# Patient Record
Sex: Female | Born: 1935 | Race: White | Hispanic: No | Marital: Married | Smoking: Former smoker
Health system: Southern US, Community
[De-identification: ages and names within clinical notes are randomized; demographics above are authoritative.]

## PROBLEM LIST (undated history)

## (undated) DIAGNOSIS — D649 Anemia, unspecified: Secondary | ICD-10-CM

## (undated) DIAGNOSIS — M1990 Unspecified osteoarthritis, unspecified site: Secondary | ICD-10-CM

## (undated) DIAGNOSIS — I714 Abdominal aortic aneurysm, without rupture, unspecified: Secondary | ICD-10-CM

## (undated) DIAGNOSIS — I10 Essential (primary) hypertension: Secondary | ICD-10-CM

## (undated) DIAGNOSIS — C801 Malignant (primary) neoplasm, unspecified: Secondary | ICD-10-CM

## (undated) DIAGNOSIS — W19XXXA Unspecified fall, initial encounter: Secondary | ICD-10-CM

## (undated) DIAGNOSIS — E785 Hyperlipidemia, unspecified: Secondary | ICD-10-CM

## (undated) DIAGNOSIS — I739 Peripheral vascular disease, unspecified: Secondary | ICD-10-CM

## (undated) DIAGNOSIS — E039 Hypothyroidism, unspecified: Secondary | ICD-10-CM

## (undated) DIAGNOSIS — N189 Chronic kidney disease, unspecified: Secondary | ICD-10-CM

## (undated) HISTORY — PX: APPENDECTOMY: SHX54

## (undated) HISTORY — DX: Abdominal aortic aneurysm, without rupture: I71.4

## (undated) HISTORY — PX: BREAST BIOPSY: SHX20

## (undated) HISTORY — DX: Essential (primary) hypertension: I10

## (undated) HISTORY — DX: Abdominal aortic aneurysm, without rupture, unspecified: I71.40

## (undated) HISTORY — DX: Malignant (primary) neoplasm, unspecified: C80.1

## (undated) HISTORY — DX: Peripheral vascular disease, unspecified: I73.9

## (undated) HISTORY — DX: Hyperlipidemia, unspecified: E78.5

## (undated) HISTORY — DX: Unspecified fall, initial encounter: W19.XXXA

## (undated) HISTORY — DX: Chronic kidney disease, unspecified: N18.9

## (undated) HISTORY — PX: OTHER SURGICAL HISTORY: SHX169

## (undated) HISTORY — DX: Anemia, unspecified: D64.9

## (undated) HISTORY — DX: Unspecified osteoarthritis, unspecified site: M19.90

## (undated) HISTORY — PX: CORONARY ARTERY BYPASS GRAFT: SHX141

---

## 1998-03-19 ENCOUNTER — Other Ambulatory Visit: Admission: RE | Admit: 1998-03-19 | Discharge: 1998-03-19 | Payer: Self-pay | Admitting: Gynecology

## 1998-07-02 ENCOUNTER — Other Ambulatory Visit: Admission: RE | Admit: 1998-07-02 | Discharge: 1998-07-02 | Payer: Self-pay | Admitting: Gynecology

## 1999-01-15 ENCOUNTER — Encounter: Payer: Self-pay | Admitting: Cardiothoracic Surgery

## 1999-01-15 ENCOUNTER — Inpatient Hospital Stay (HOSPITAL_COMMUNITY): Admission: AD | Admit: 1999-01-15 | Discharge: 1999-01-21 | Payer: Self-pay | Admitting: *Deleted

## 1999-01-16 ENCOUNTER — Encounter: Payer: Self-pay | Admitting: Cardiothoracic Surgery

## 1999-01-17 ENCOUNTER — Encounter: Payer: Self-pay | Admitting: Cardiothoracic Surgery

## 1999-01-18 ENCOUNTER — Encounter: Payer: Self-pay | Admitting: Cardiothoracic Surgery

## 1999-01-19 ENCOUNTER — Encounter: Payer: Self-pay | Admitting: Cardiothoracic Surgery

## 1999-01-20 ENCOUNTER — Encounter: Payer: Self-pay | Admitting: Cardiothoracic Surgery

## 1999-06-04 ENCOUNTER — Other Ambulatory Visit: Admission: RE | Admit: 1999-06-04 | Discharge: 1999-06-04 | Payer: Self-pay | Admitting: Gynecology

## 1999-12-10 ENCOUNTER — Other Ambulatory Visit: Admission: RE | Admit: 1999-12-10 | Discharge: 1999-12-10 | Payer: Self-pay | Admitting: Gynecology

## 2001-05-04 ENCOUNTER — Other Ambulatory Visit: Admission: RE | Admit: 2001-05-04 | Discharge: 2001-05-04 | Payer: Self-pay | Admitting: Gynecology

## 2002-06-06 ENCOUNTER — Other Ambulatory Visit: Admission: RE | Admit: 2002-06-06 | Discharge: 2002-06-06 | Payer: Self-pay | Admitting: Gynecology

## 2002-08-30 HISTORY — PX: CORONARY ANGIOPLASTY WITH STENT PLACEMENT: SHX49

## 2003-03-29 ENCOUNTER — Encounter: Payer: Self-pay | Admitting: Cardiology

## 2003-03-29 ENCOUNTER — Inpatient Hospital Stay (HOSPITAL_COMMUNITY): Admission: RE | Admit: 2003-03-29 | Discharge: 2003-04-03 | Payer: Self-pay | Admitting: Cardiology

## 2003-07-19 ENCOUNTER — Other Ambulatory Visit: Admission: RE | Admit: 2003-07-19 | Discharge: 2003-07-19 | Payer: Self-pay | Admitting: Gynecology

## 2005-02-26 ENCOUNTER — Ambulatory Visit: Payer: Self-pay | Admitting: Cardiology

## 2005-03-16 ENCOUNTER — Ambulatory Visit: Payer: Self-pay

## 2005-04-15 ENCOUNTER — Ambulatory Visit: Payer: Self-pay | Admitting: Cardiology

## 2005-04-30 ENCOUNTER — Ambulatory Visit: Payer: Self-pay

## 2005-06-07 ENCOUNTER — Ambulatory Visit: Payer: Self-pay | Admitting: Cardiology

## 2005-06-22 ENCOUNTER — Ambulatory Visit: Payer: Self-pay

## 2005-10-20 ENCOUNTER — Ambulatory Visit: Payer: Self-pay | Admitting: Cardiology

## 2005-12-21 ENCOUNTER — Ambulatory Visit: Payer: Self-pay

## 2006-02-15 ENCOUNTER — Other Ambulatory Visit: Admission: RE | Admit: 2006-02-15 | Discharge: 2006-02-15 | Payer: Self-pay | Admitting: Gynecology

## 2006-03-16 ENCOUNTER — Ambulatory Visit: Payer: Self-pay

## 2006-03-22 ENCOUNTER — Ambulatory Visit: Payer: Self-pay | Admitting: Cardiology

## 2006-09-06 ENCOUNTER — Ambulatory Visit: Payer: Self-pay | Admitting: Cardiology

## 2006-09-06 ENCOUNTER — Ambulatory Visit (HOSPITAL_COMMUNITY): Admission: RE | Admit: 2006-09-06 | Discharge: 2006-09-06 | Payer: Self-pay | Admitting: Cardiology

## 2007-03-23 ENCOUNTER — Ambulatory Visit: Payer: Self-pay | Admitting: Cardiology

## 2008-01-07 IMAGING — CR DG THORACIC SPINE 2V
3 series · 3 of 3 positions shown · non-contrast
Comparison: None.

CLINICAL DATA: No injury.  Neck pain and shoulder pain. 
 CERVICAL SPINE ? 4 VIEW:

[t t-spine a.p.]
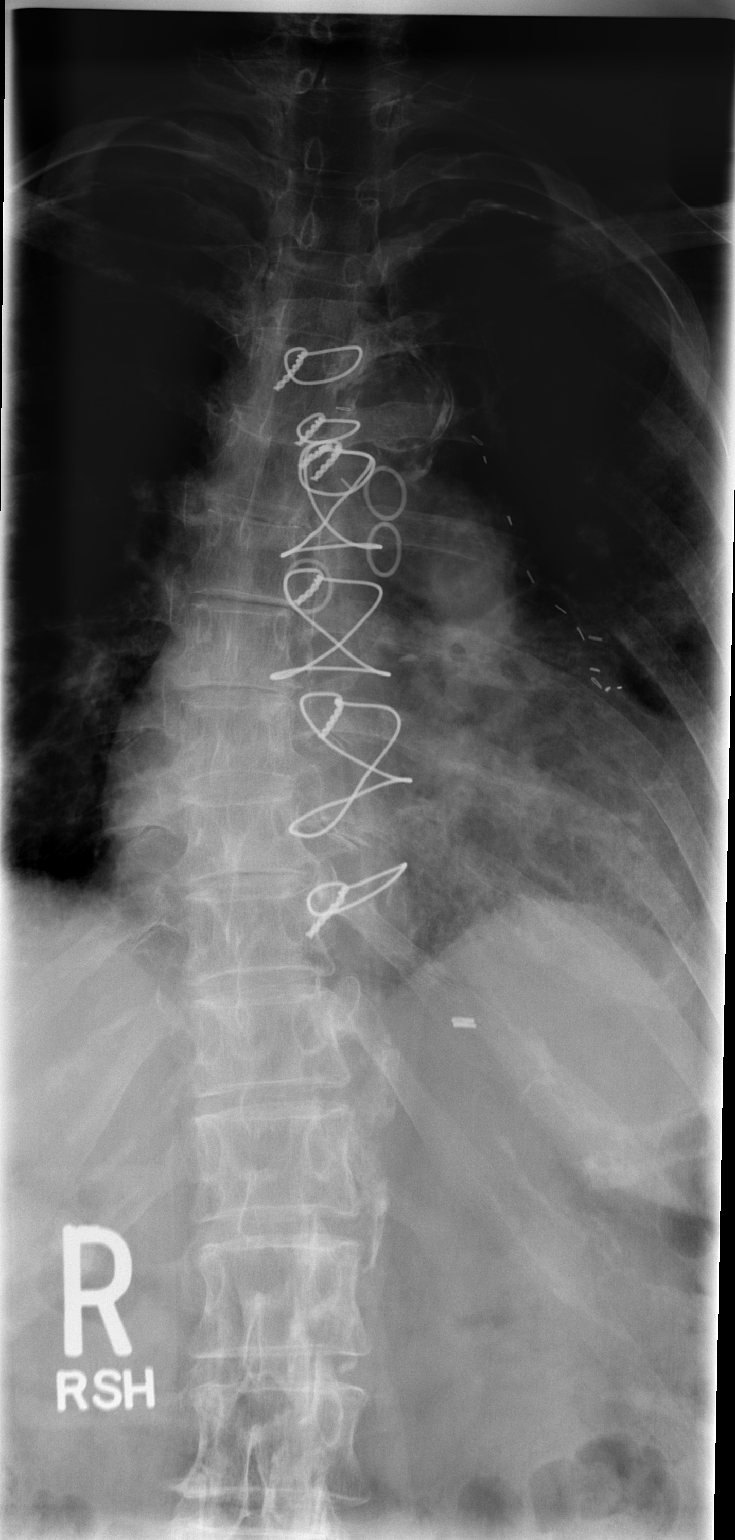

[t t-spine lat (1 of 2)]
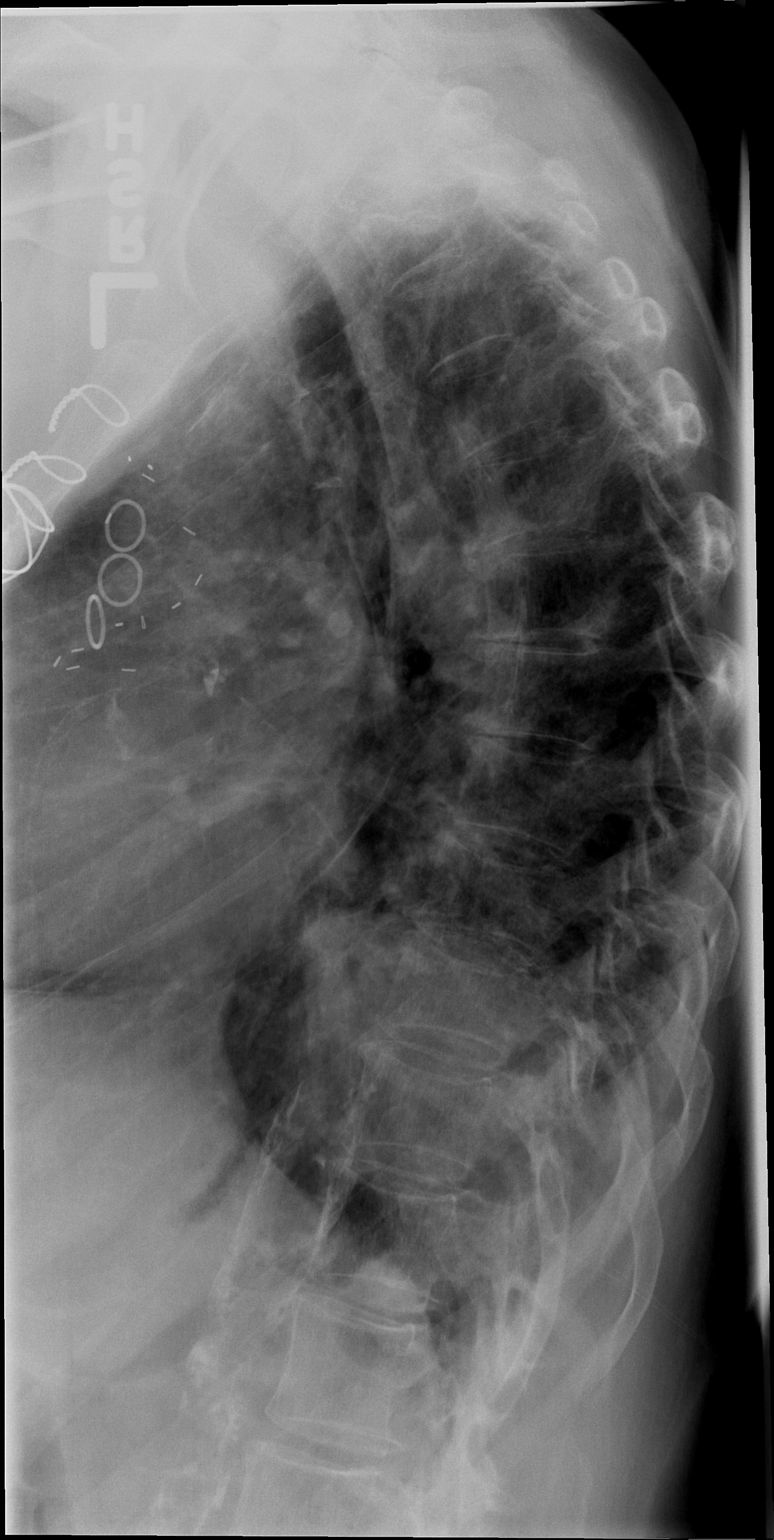

[t t-spine lat (2 of 2)]
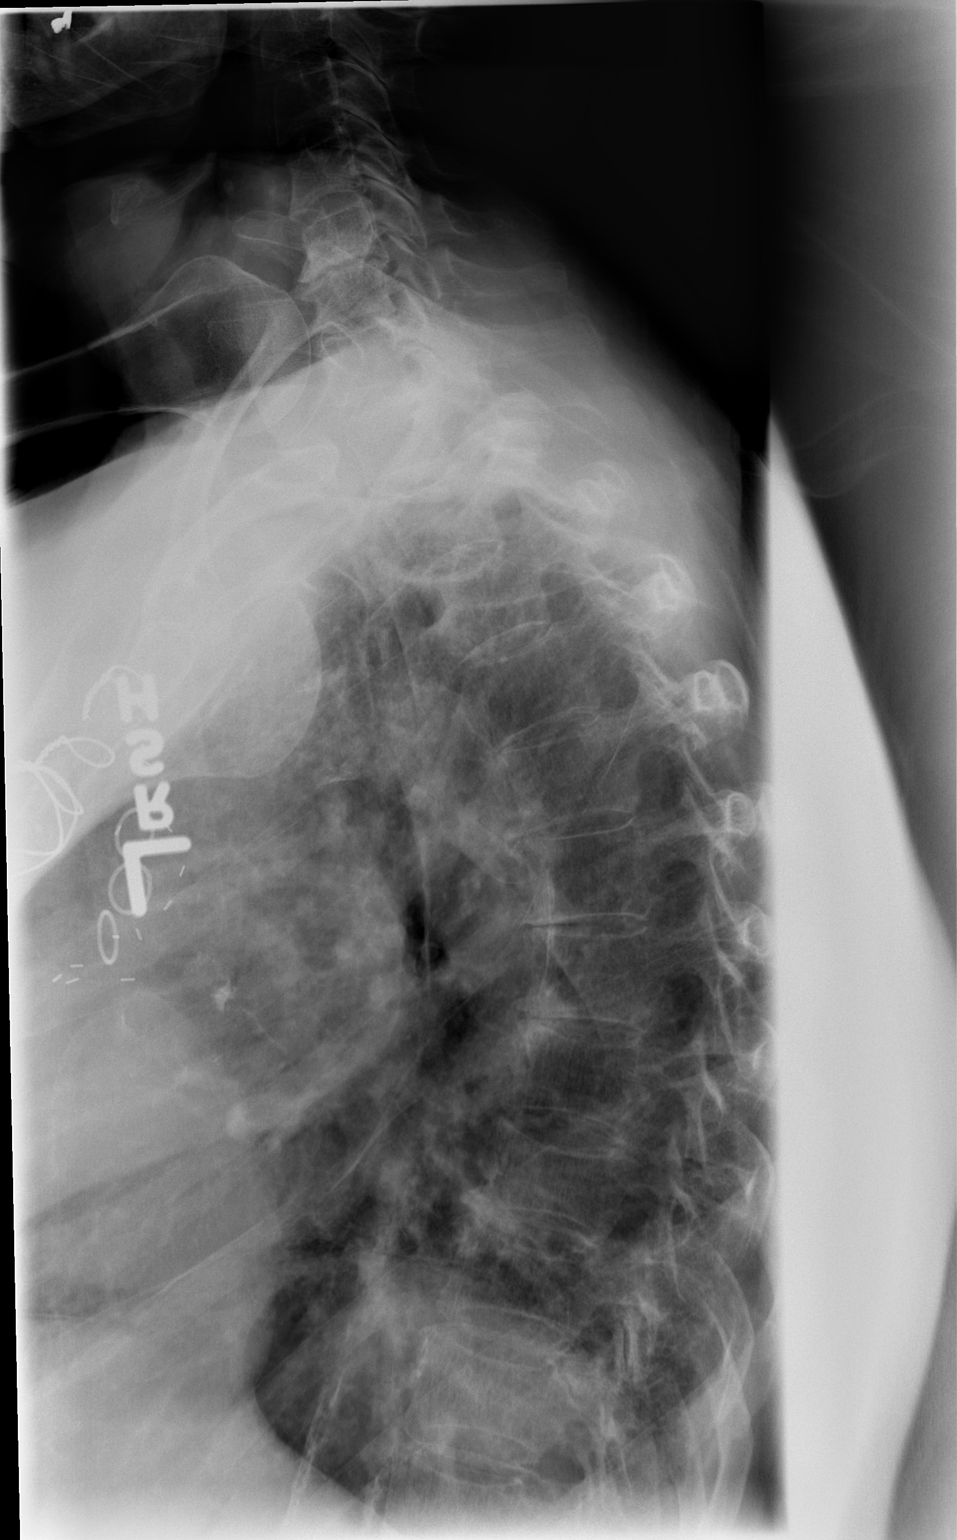

[3 of 3 positions shown; findings below may reference images not displayed]

FINDINGS: There is severe narrowing of the C5, C6 and C6-7 discs with posterior osteophytic ridging.  There reversal of the normal cervical lordosis as well.  Soft tissues are within normal limits. There is no vertebral body height loss.  Right 5-6 neural foraminal narrowing and left 6-7 neural foraminal narrowing is present.  There is failure of fusion of the posterior elements of C7.
IMPRESSION: Marked degenerative disc disease at C5-6 and C6-7 as described. 
 THORACIC SPINE ? 2 VIEW:
FINDINGS: Mild dextroscoliosis of the mid thoracic spine is present.  The bones are demineralized.  There is no vertebral body height loss to suggest a compression fracture.   Disc space narrowing is present throughout the mid thoracic spine.  Vascular calcifications are prominent.
IMPRESSION: Minimal dextroscoliosis.  No acute bony injury.

## 2008-01-07 IMAGING — CR DG CERVICAL SPINE COMPLETE 4+V
7 series · 7 of 7 positions shown · non-contrast
Comparison: None.

CLINICAL DATA: No injury.  Neck pain and shoulder pain. 
 CERVICAL SPINE ? 4 VIEW:

[w c-spine lat]
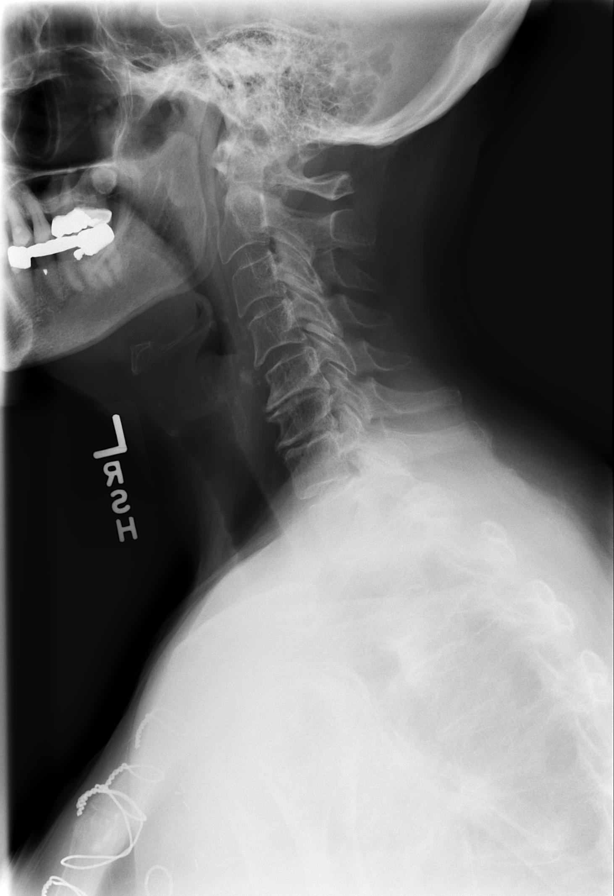

[w c-spine oblique (1 of 2)]
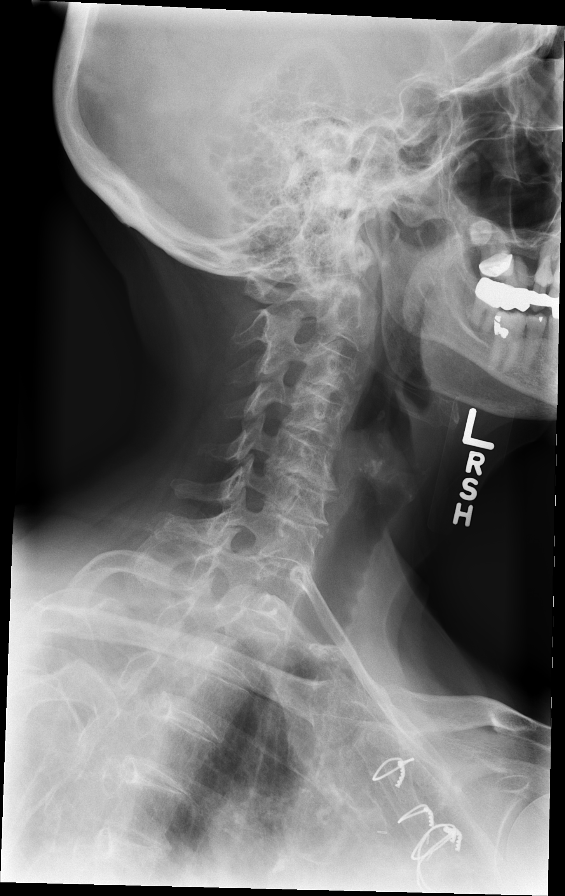

[w c-spine oblique (2 of 2)]
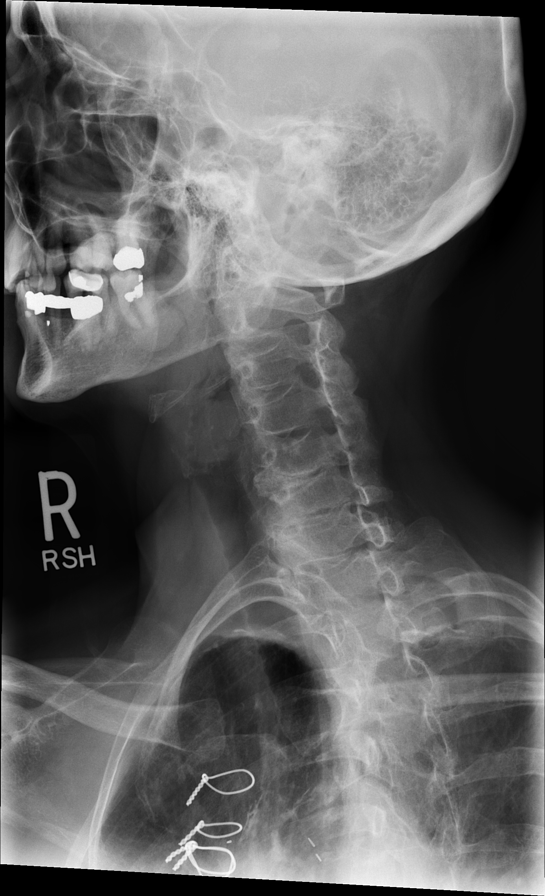

[w c-spine a.p.]
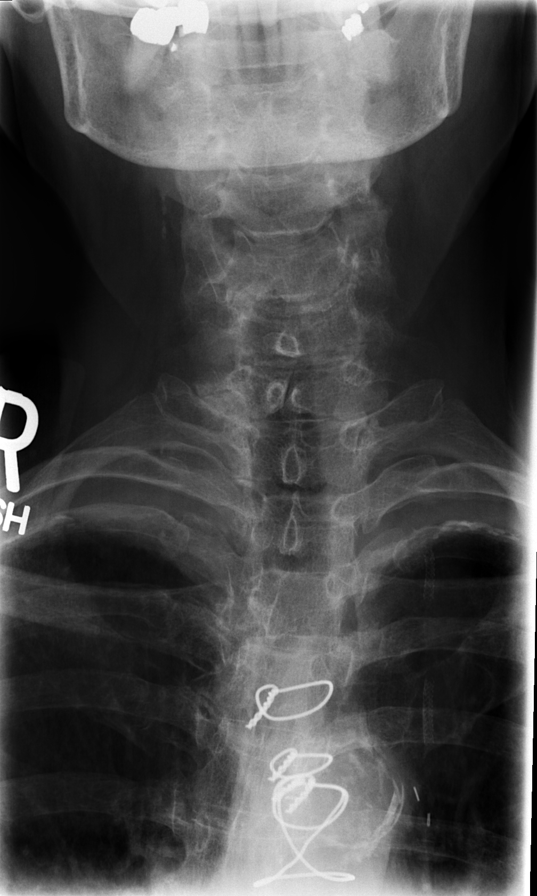

[w c-spine odontoid]
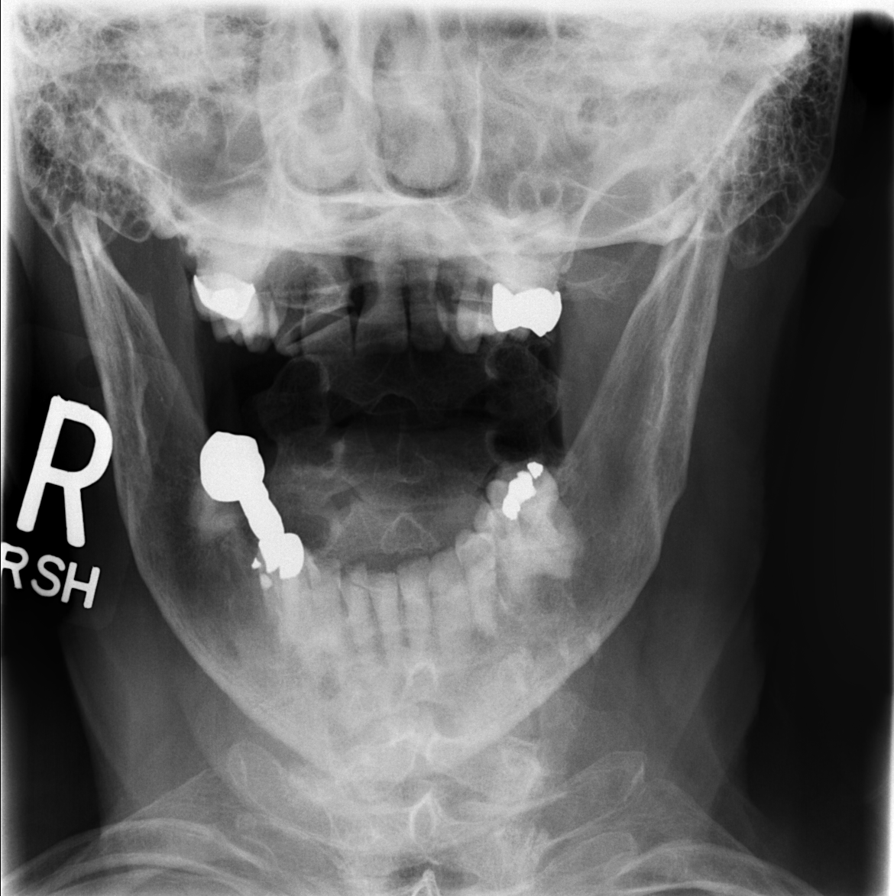

[t c-spine odontoid (1 of 2)]
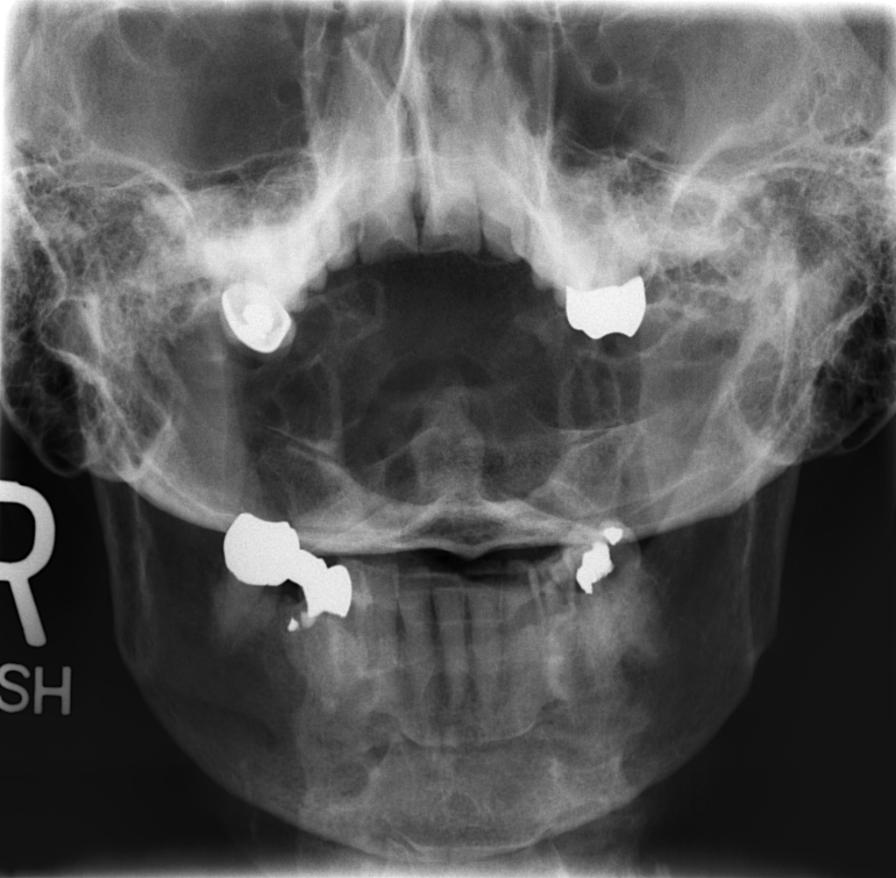

[t c-spine odontoid (2 of 2)]
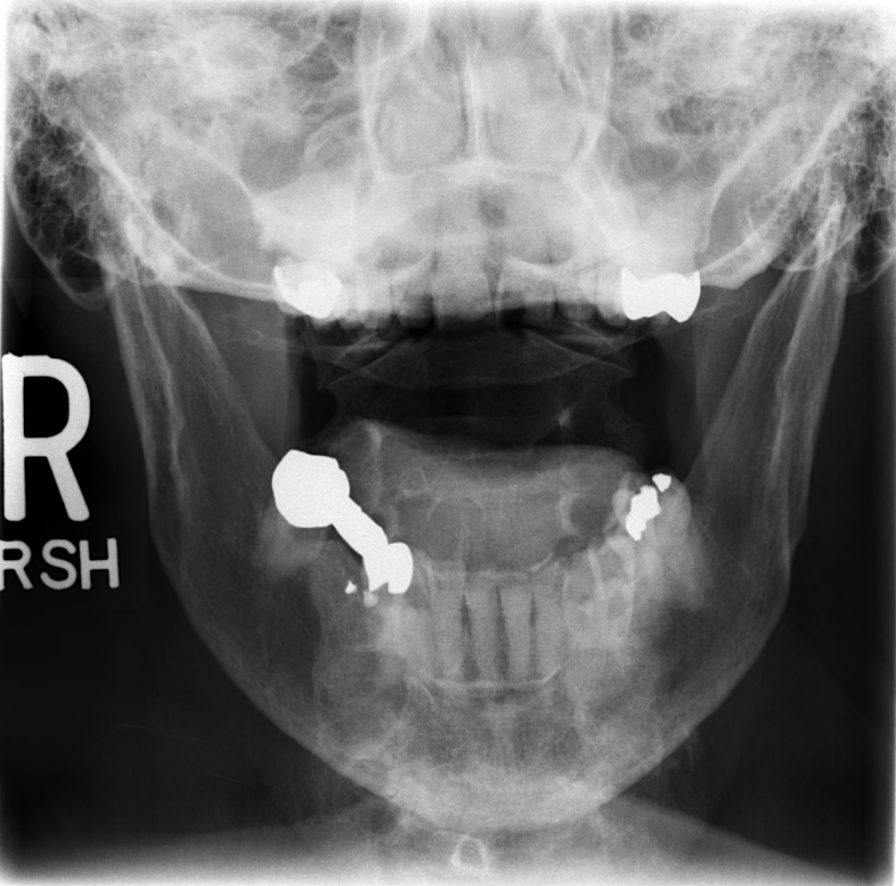

[7 of 7 positions shown; findings below may reference images not displayed]

FINDINGS: There is severe narrowing of the C5, C6 and C6-7 discs with posterior osteophytic ridging.  There reversal of the normal cervical lordosis as well.  Soft tissues are within normal limits. There is no vertebral body height loss.  Right 5-6 neural foraminal narrowing and left 6-7 neural foraminal narrowing is present.  There is failure of fusion of the posterior elements of C7.
IMPRESSION: Marked degenerative disc disease at C5-6 and C6-7 as described. 
 THORACIC SPINE ? 2 VIEW:
FINDINGS: Mild dextroscoliosis of the mid thoracic spine is present.  The bones are demineralized.  There is no vertebral body height loss to suggest a compression fracture.   Disc space narrowing is present throughout the mid thoracic spine.  Vascular calcifications are prominent.
IMPRESSION: Minimal dextroscoliosis.  No acute bony injury.

## 2008-04-05 ENCOUNTER — Ambulatory Visit: Payer: Self-pay | Admitting: Vascular Surgery

## 2008-04-23 ENCOUNTER — Encounter: Payer: Self-pay | Admitting: Cardiology

## 2008-04-23 ENCOUNTER — Ambulatory Visit: Payer: Self-pay

## 2008-04-23 ENCOUNTER — Ambulatory Visit: Payer: Self-pay | Admitting: Cardiology

## 2008-04-25 ENCOUNTER — Inpatient Hospital Stay (HOSPITAL_COMMUNITY): Admission: RE | Admit: 2008-04-25 | Discharge: 2008-04-26 | Payer: Self-pay | Admitting: Vascular Surgery

## 2008-04-25 ENCOUNTER — Ambulatory Visit: Payer: Self-pay | Admitting: Vascular Surgery

## 2008-04-25 ENCOUNTER — Encounter: Payer: Self-pay | Admitting: Vascular Surgery

## 2008-05-10 ENCOUNTER — Ambulatory Visit: Payer: Self-pay | Admitting: Vascular Surgery

## 2008-10-18 ENCOUNTER — Ambulatory Visit: Payer: Self-pay | Admitting: Cardiology

## 2008-11-01 ENCOUNTER — Ambulatory Visit: Payer: Self-pay | Admitting: Vascular Surgery

## 2009-02-14 ENCOUNTER — Encounter: Payer: Self-pay | Admitting: Cardiology

## 2009-04-11 ENCOUNTER — Ambulatory Visit: Payer: Self-pay | Admitting: Vascular Surgery

## 2009-04-18 DIAGNOSIS — I25810 Atherosclerosis of coronary artery bypass graft(s) without angina pectoris: Secondary | ICD-10-CM

## 2009-04-18 DIAGNOSIS — M1990 Unspecified osteoarthritis, unspecified site: Secondary | ICD-10-CM | POA: Insufficient documentation

## 2009-04-18 DIAGNOSIS — I10 Essential (primary) hypertension: Secondary | ICD-10-CM

## 2009-04-18 DIAGNOSIS — E039 Hypothyroidism, unspecified: Secondary | ICD-10-CM

## 2009-04-18 DIAGNOSIS — E785 Hyperlipidemia, unspecified: Secondary | ICD-10-CM | POA: Insufficient documentation

## 2009-04-18 DIAGNOSIS — M109 Gout, unspecified: Secondary | ICD-10-CM

## 2009-04-18 DIAGNOSIS — C44309 Unspecified malignant neoplasm of skin of other parts of face: Secondary | ICD-10-CM | POA: Insufficient documentation

## 2009-04-18 DIAGNOSIS — C44300 Unspecified malignant neoplasm of skin of unspecified part of face: Secondary | ICD-10-CM | POA: Insufficient documentation

## 2009-04-18 DIAGNOSIS — I6529 Occlusion and stenosis of unspecified carotid artery: Secondary | ICD-10-CM

## 2009-04-18 DIAGNOSIS — Z8679 Personal history of other diseases of the circulatory system: Secondary | ICD-10-CM | POA: Insufficient documentation

## 2009-04-22 ENCOUNTER — Ambulatory Visit: Payer: Self-pay | Admitting: Cardiology

## 2009-08-08 ENCOUNTER — Telehealth (INDEPENDENT_AMBULATORY_CARE_PROVIDER_SITE_OTHER): Payer: Self-pay | Admitting: *Deleted

## 2009-08-13 ENCOUNTER — Ambulatory Visit: Payer: Self-pay | Admitting: Cardiology

## 2009-08-13 ENCOUNTER — Inpatient Hospital Stay (HOSPITAL_COMMUNITY): Admission: EM | Admit: 2009-08-13 | Discharge: 2009-08-18 | Payer: Self-pay

## 2009-08-14 ENCOUNTER — Encounter (INDEPENDENT_AMBULATORY_CARE_PROVIDER_SITE_OTHER): Payer: Self-pay | Admitting: Internal Medicine

## 2009-08-20 IMAGING — CR DG CHEST 2V
2 series · 2 of 2 positions shown · non-contrast
Comparison: None

CLINICAL DATA: Preoperative respiratory exam.  Left internal
carotid artery stenosis.

CHEST - 2 VIEW

[view not recorded (1 of 2)]
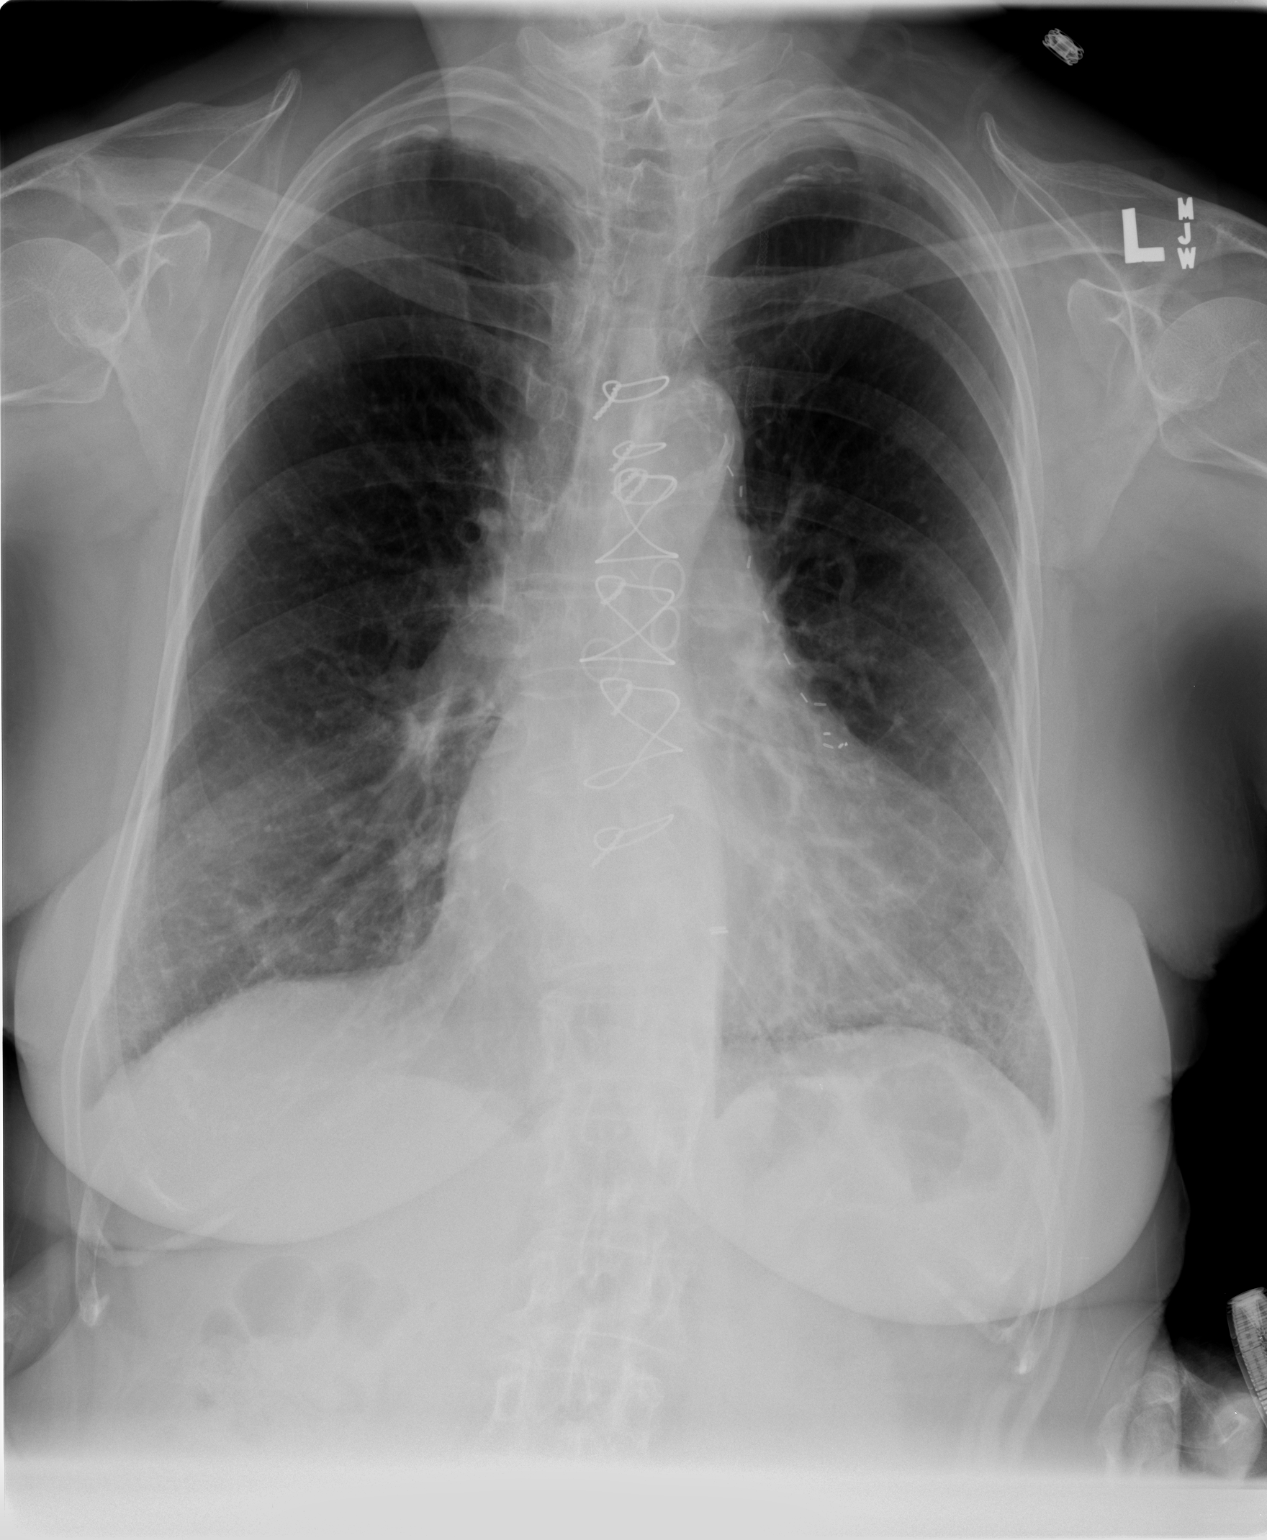

[view not recorded (2 of 2)]
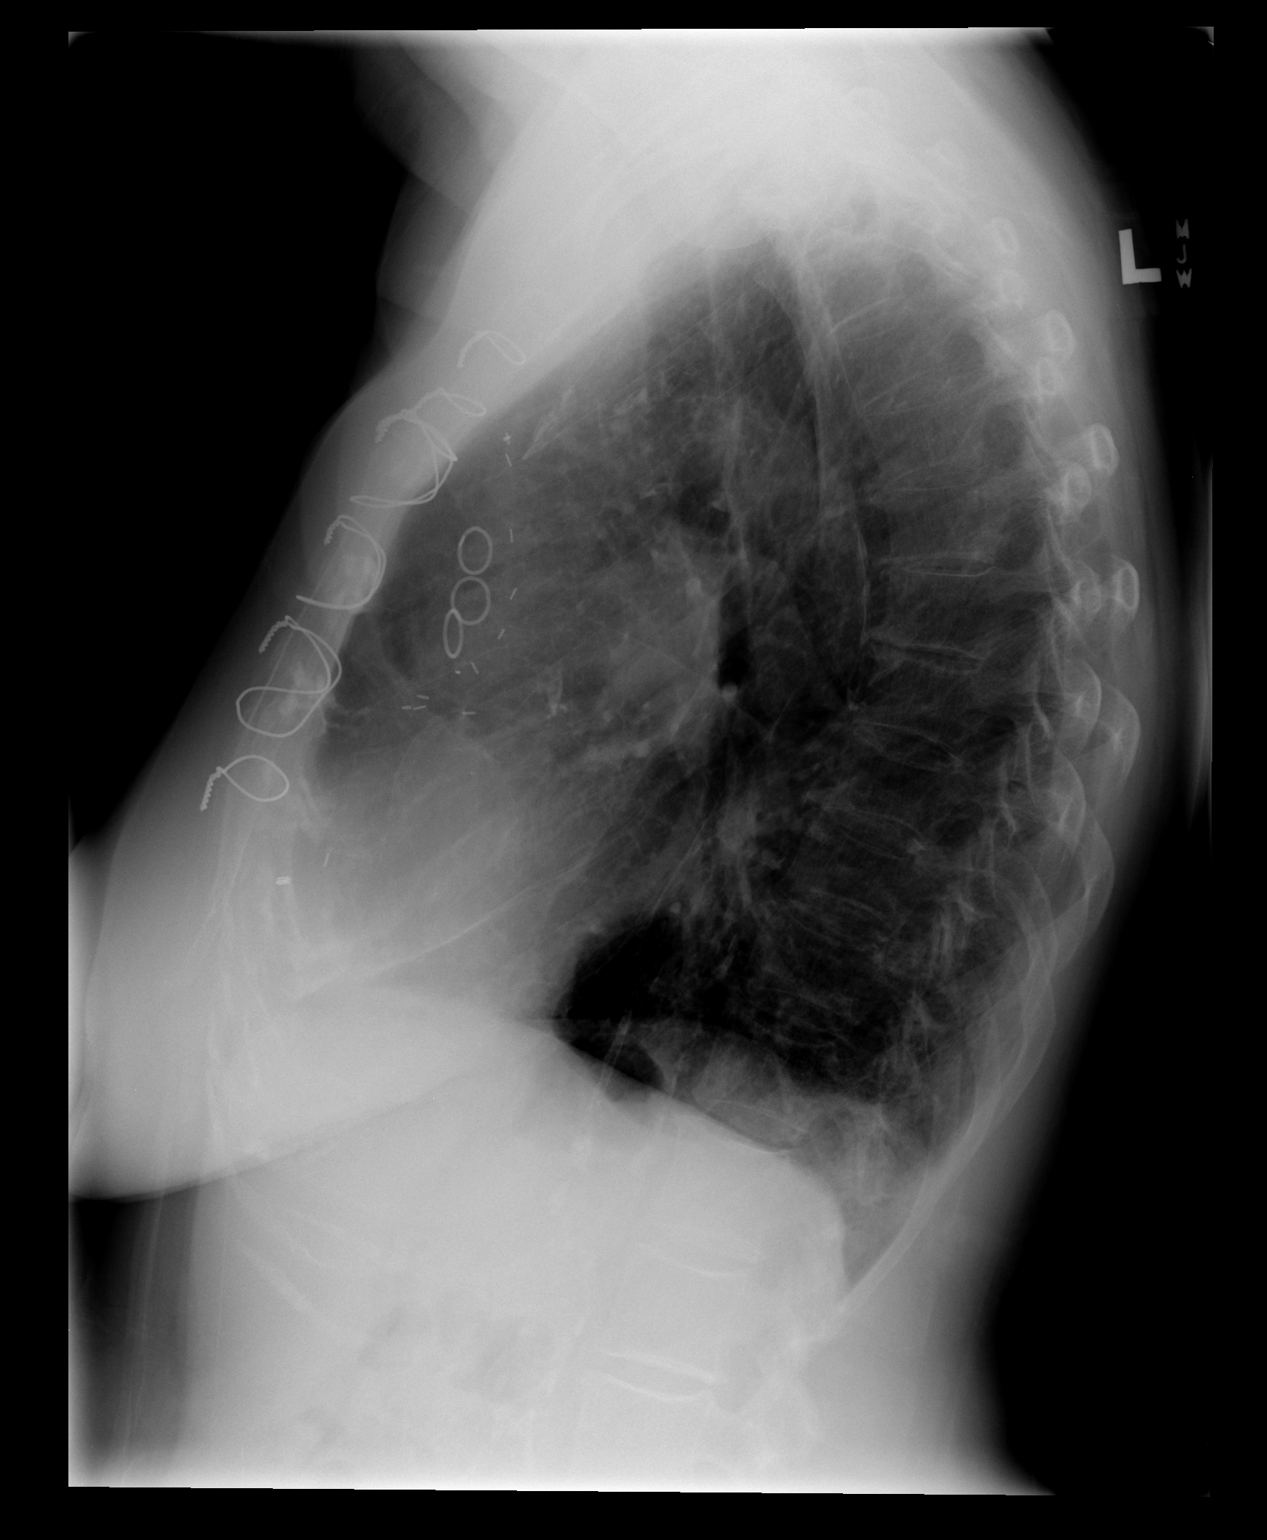

[2 of 2 positions shown; findings below may reference images not displayed]

FINDINGS: There is chronic cardiomegaly with evidence of prior
CABG.  COPD with some scarring at the left lung base.  Mild
thoracolumbar scoliosis.  Calcification in the brachiocephalic
vessels.
IMPRESSION: No acute disease.  COPD and cardiomegaly.

## 2009-10-10 ENCOUNTER — Ambulatory Visit: Payer: Self-pay | Admitting: Vascular Surgery

## 2009-12-18 ENCOUNTER — Encounter: Payer: Self-pay | Admitting: Cardiology

## 2009-12-25 ENCOUNTER — Ambulatory Visit: Payer: Self-pay | Admitting: Cardiology

## 2010-05-18 ENCOUNTER — Encounter: Payer: Self-pay | Admitting: Cardiology

## 2010-05-29 ENCOUNTER — Ambulatory Visit: Payer: Self-pay | Admitting: Vascular Surgery

## 2010-08-07 ENCOUNTER — Encounter: Payer: Self-pay | Admitting: Cardiology

## 2010-09-29 NOTE — Assessment & Plan Note (Signed)
Summary: f1y   Visit Type:  1 year follow up Primary Provider:  schultz  CC:  Chest discomfort and Sob.  History of Present Illness: Patient ended up at [facility] with both kidney and heart failure.  Kidneys are improved.  Patient is getting shots to help with blood counts.  Some times will get sore in the chest, but not pain in general.   Patient is not clear that Ranexa was of any help.  BP was better at home.  Review of records suggest that she was volume contracted, and improved.  EF was normal during that admission.  Continues to follow with Dr. Arbie Cookey regarding carotids.    Current Medications (verified): 1)  Lasix 40 Mg Tabs (Furosemide) .... Take 1 Tablet By Mouth Two Times A Day 2)  Synthroid 75 Mcg Tabs (Levothyroxine Sodium) .... Take 1 Tablet By Mouth Once A Day 3)  Plavix 75 Mg Tabs (Clopidogrel Bisulfate) .... Take One Tablet By Mouth Daily 4)  Allopurinol 100 Mg Tabs (Allopurinol) 5)  Actonel 35 Mg Tabs (Risedronate Sodium) .... Once A Week 6)  Imdur 60 Mg Xr24h-Tab (Isosorbide Mononitrate) .... Take 1 Tablet By Mouth Once A Day 7)  Lipitor 40 Mg Tabs (Atorvastatin Calcium) .... Take One Tablet By Mouth Daily. 8)  Klor-Con M20 20 Meq Cr-Tabs (Potassium Chloride Crys Cr) .... Take 1 Tablet By Mouth Once A Day 9)  Premarin 0.625 Mg/gm Crea (Estrogens, Conjugated) .... Once A Week 10)  Nitroglycerin 0.4 Mg Subl (Nitroglycerin) .... One Tablet Under Tongue Every 5 Minutes As Needed For Chest Pain---May Repeat Times Three 11)  Ranexa 500 Mg Xr12h-Tab (Ranolazine) .... Take One Tablet By Mouth Twice A Day 12)  Claritin 10 Mg Tabs (Loratadine) .... Take 1 Tablet By Mouth Once A Day 13)  Carvedilol 3.125 Mg Tabs (Carvedilol) .... Take One Tablet By Mouth Twice A Day 14)  Augmentin 125-31.25 Mg/61ml Susr (Amoxicillin-Pot Clavulanate) .... Three Times A Day 15)  Mucinex Dm 30-600 Mg Xr12h-Tab (Dextromethorphan-Guaifenesin) .... As Needed 16)  Meclizine Hcl 25 Mg Tabs (Meclizine Hcl) .... As  Needed  Allergies: 1)  ! Asa 2)  ! Erythromycin 3)  ! Codeine 4)  ! Darvocet 5)  ! Levaquin 6)  ! Keflex 7)  ! Vibramycin 8)  ! * Prometrium 9)  ! * Nylon 10)  ! * Polyester 11)  ! * Oregano 12)  ! * Ceffin 13)  ! * Omnicef 14)  ! * Asmanex 15)  ! Norvasc 16)  ! Diovan 17)  ! * Contrast Dye 18)  ! * Chocolate 19)  ! * Peanuts  Vital Signs:  Patient profile:   77 year old female Height:      62 inches Weight:      137.25 pounds BMI:     25.19 Pulse rate:   67 / minute Pulse rhythm:   regular Resp:     18 per minute BP sitting:   200 / 76  (left arm) Cuff size:   large  Vitals Entered By: Vikki Ports (December 25, 2009 11:02 AM)  Physical Exam  General:  Well developed, well nourished, in no acute distress. Head:  normocephalic and atraumatic Eyes:  PERRLA/EOM intact; conjunctiva and lids normal. Neck:  Loud left carotid bruit.   Lungs:  Clear bilaterally to auscultation and percussion. Heart:  PMI non displaced.  Normal S1 and S2.  SEM 2/6.   Abdomen:  prominent abdominal bruit.  No masses.   Msk:  Back normal, normal  gait. Muscle strength and tone normal. Neurologic:  Alert and oriented x 3.   Echocardiogram  Procedure date:  08/14/2009  Findings:       Study Conclusions    - Left ventricle: The cavity size was normal. Wall thickness was     increased in a pattern of mild LVH. The estimated ejection     fraction was 65%. Wall motion was normal; there were no regional     wall motion abnormalities.   - Right ventricle: The cavity size was normal. Systolic function was     normal.  EKG  Procedure date:  12/25/2009  Findings:      NSR.  RBBB.  Abnormal ECG..  Impression & Recommendations:  Problem # 1:  CAD, ARTERY BYPASS GRAFT (ICD-414.04) symptoms may be better on Ranexa, but hard to tell.  She is not sure.  Tolerating.  ECG looks similar to before, QTc is currently about , but in setting of RBBB.  Not sure if she needs this or not.     Her updated medication list for this problem includes:    Plavix 75 Mg Tabs (Clopidogrel bisulfate) .Marland Kitchen... Take one tablet by mouth daily    Imdur 60 Mg Xr24h-tab (Isosorbide mononitrate) .Marland Kitchen... Take 1 tablet by mouth once a day    Nitroglycerin 0.4 Mg Subl (Nitroglycerin) ..... One tablet under tongue every 5 minutes as needed for chest pain---may repeat times three    Ranexa 500 Mg Xr12h-tab (Ranolazine) .Marland Kitchen... Take one tablet by mouth twice a day    Carvedilol 3.125 Mg Tabs (Carvedilol) .Marland Kitchen... Take one tablet by mouth twice a day  Orders: EKG w/ Interpretation (93000)  Problem # 2:  CAROTID ARTERY STENOSIS, LEFT (ICD-433.10) followed by Dr. Arbie Cookey.   Her updated medication list for this problem includes:    Plavix 75 Mg Tabs (Clopidogrel bisulfate) .Marland Kitchen... Take one tablet by mouth daily  Problem # 3:  HYPERLIPIDEMIA (ICD-272.4) followed by Dr. Tomasa Blase.   Her updated medication list for this problem includes:    Lipitor 40 Mg Tabs (Atorvastatin calcium) .Marland Kitchen... Take one tablet by mouth daily.  Problem # 4:  ABDOMINAL AORTIC ANEURYSM, HX OF (ICD-V12.50) Does not  want followup.    Patient Instructions: 1)  Your physician recommends that you schedule a follow-up appointment as needed. 2)  Your physician recommends that you continue on your current medications as directed. Please refer to the Current Medication list given to you today.

## 2010-10-02 NOTE — Letter (Signed)
[facility]   Imported By: Marylou Mccoy 05/28/2010 14:00:02  _____________________________________________________________________  External Attachment:    Type:   Image     Comment:   External Document

## 2010-11-12 ENCOUNTER — Encounter: Payer: Self-pay | Admitting: Cardiology

## 2010-11-30 LAB — TSH: TSH: 2.48 u[IU]/mL (ref 0.350–4.500)

## 2010-11-30 LAB — BASIC METABOLIC PANEL
BUN: 15 mg/dL (ref 6–23)
BUN: 16 mg/dL (ref 6–23)
CO2: 24 mEq/L (ref 19–32)
Calcium: 8.4 mg/dL (ref 8.4–10.5)
Chloride: 109 mEq/L (ref 96–112)
Chloride: 111 mEq/L (ref 96–112)
Creatinine, Ser: 1.35 mg/dL — ABNORMAL HIGH (ref 0.4–1.2)
GFR calc Af Amer: 51 mL/min — ABNORMAL LOW (ref >60)
GFR calc non Af Amer: 12 mL/min — ABNORMAL LOW (ref >60)
GFR calc non Af Amer: 42 mL/min — ABNORMAL LOW (ref >60)
Glucose, Bld: 156 mg/dL — ABNORMAL HIGH (ref 70–99)
Glucose, Bld: 94 mg/dL (ref 70–99)
Glucose, Bld: 97 mg/dL (ref 70–99)
Potassium: 3.5 mEq/L (ref 3.5–5.1)
Potassium: 3.5 mEq/L (ref 3.5–5.1)
Potassium: 3.8 mEq/L (ref 3.5–5.1)
Potassium: 4.5 mEq/L (ref 3.5–5.1)
Sodium: 138 mEq/L (ref 135–145)
Sodium: 139 mEq/L (ref 135–145)
Sodium: 141 mEq/L (ref 135–145)

## 2010-11-30 LAB — IRON AND TIBC
Iron: 34 ug/dL — ABNORMAL LOW (ref 42–135)
Saturation Ratios: 15 % — ABNORMAL LOW (ref 20–55)
TIBC: 221 ug/dL — ABNORMAL LOW (ref 250–470)
UIBC: 187 ug/dL

## 2010-11-30 LAB — CBC
HCT: 26.9 % — ABNORMAL LOW (ref 36.0–46.0)
MCHC: 33.3 g/dL (ref 30.0–36.0)
MCHC: 33.7 g/dL (ref 30.0–36.0)
MCV: 102.9 fL — ABNORMAL HIGH (ref 78.0–100.0)
MCV: 103 fL — ABNORMAL HIGH (ref 78.0–100.0)
Platelets: 182 10*3/uL (ref 150–400)
RBC: 2.62 MIL/uL — ABNORMAL LOW (ref 3.87–5.11)
RDW: 17.3 % — ABNORMAL HIGH (ref 11.5–15.5)
WBC: 8.9 10*3/uL (ref 4.0–10.5)

## 2010-11-30 LAB — URINALYSIS, MICROSCOPIC ONLY
Bilirubin Urine: NEGATIVE
Nitrite: NEGATIVE
Specific Gravity, Urine: 1.013 (ref 1.005–1.030)
pH: 5.5 (ref 5.0–8.0)

## 2010-11-30 LAB — COMPREHENSIVE METABOLIC PANEL
AST: 67 U/L — ABNORMAL HIGH (ref 0–37)
Albumin: 2.5 g/dL — ABNORMAL LOW (ref 3.5–5.2)
Calcium: 8.3 mg/dL — ABNORMAL LOW (ref 8.4–10.5)
Creatinine, Ser: 1.94 mg/dL — ABNORMAL HIGH (ref 0.4–1.2)
GFR calc Af Amer: 30 mL/min — ABNORMAL LOW (ref >60)
Total Protein: 5.5 g/dL — ABNORMAL LOW (ref 6.0–8.3)

## 2010-11-30 LAB — RETICULOCYTES: Retic Count, Absolute: 92.8 10*3/uL (ref 19.0–186.0)

## 2010-11-30 LAB — URINE CULTURE

## 2010-11-30 LAB — FOLATE RBC: RBC Folate: 805 ng/mL — ABNORMAL HIGH (ref 180–600)

## 2010-11-30 LAB — BRAIN NATRIURETIC PEPTIDE: Pro B Natriuretic peptide (BNP): 303 pg/mL — ABNORMAL HIGH (ref 0.0–100.0)

## 2010-12-02 ENCOUNTER — Other Ambulatory Visit: Payer: Self-pay

## 2011-01-12 NOTE — Procedures (Signed)
CAROTID DUPLEX EXAM   INDICATION:  Followup known carotid artery disease.   HISTORY:  Diabetes:  No.  Cardiac:  Hypertension:  Yes.  Smoking:  Previous Surgery:  CV History:  Amaurosis Fugax No, Paresthesias No, Hemiparesis No                                       RIGHT             LEFT  Brachial systolic pressure:         143               183  Brachial Doppler waveforms:  Vertebral direction of flow:        Antegrade         Antegrade  DUPLEX VELOCITIES (cm/sec)  CCA peak systolic                   80                113  ECA peak systolic                   209               242  ICA peak systolic                   225               435  ICA end diastolic                   48                116  PLAQUE MORPHOLOGY:                  Calcified         Calcified  PLAQUE AMOUNT:                      Moderate to severe                  Severe  PLAQUE LOCATION:                    ICA and ECA       ICA and ECA   IMPRESSION:  1. 80-99% stenosis noted in the left ICA.  2. 60-79% stenosis noted in the right ICA.  3. Antegrade bilateral vertebral arteries.   ___________________________________________  Larina Earthly, M.D.   MG/MEDQ  D:  04/05/2008  T:  04/05/2008  Job:  [id number]

## 2011-01-12 NOTE — H&P (Signed)
HISTORY AND PHYSICAL EXAMINATION   April 05, 2008   Re:  [name], [name]                   DOB:  03/13/1924   DATE OF ADMISSION:  To be determined.   The patient presents today for continued followup of her known carotid  disease.  We have not seen her in several years.  She does have known  moderate stenoses, and in fact, has had carotid duplex in May of 2008  suggesting significant disease.  She, fortunately, has remained  asymptomatic.  She does not have any history of stroke, amaurosis fugax,  or transient ischemic attack.  She does have a history of prior coronary  artery disease.  She is status post a prior coronary artery bypass  grafting and subsequent stenting.  She does have a history of  hypothyroidism, hyperlipidemia, hypertension.   MEDICATIONS:  Synthroid, potassium, Plavix, Toprol, allopurinol,  Allegra, Imdur, Lipitor, Zetia, Premarin, Actonel, Mucinex, Furosemide,  Singulair, and Lomotil.   She reports allergy to aspirin, Darvocet, erythromycin, Keflex, and  Levaquin, Vibramycin, x-ray dye, and Prometrium, and peanuts.   PHYSICAL EXAM:  Well-developed, well-nourished white female appearing  stated age of 49.  Her blood pressure is 143/56, pulse 60, respirations  18.  She does have a harsh left carotid bruit.  No bruits on the right.  She has 2+ radial pulses bilaterally.  She is grossly intact  neurologically.   She underwent repeat carotid duplex in our office today which revealed  critical stenosis in her left carotid system, moderate stenosis in her  right carotid system.  She is right handed.  I discussed options with  the patient and have recommended endarterectomy for reduction of stroke  risk.  I explained the procedure, including the 1% to 2% risk of stroke  with surgery.  It has been some time since she has seen Dr. Shawnie Pons for cardiac evaluation.  We will arrange a visit with him prior  to this elective surgery.  Assuming  this is negative, she will be  admitted for left carotid endarterectomy and Dacron patch angioplasty.   Larina Earthly, M.D.  Electronically Signed   TFE/MEDQ  D:  04/05/2008  T:  04/08/2008  Job:  [id number]   cc:   Dr. Wilburn Mylar D. Riley Kill, MD, [facility]

## 2011-01-12 NOTE — Procedures (Signed)
DUPLEX ULTRASOUND OF ABDOMINAL AORTA   INDICATION:  Known abdominal aortic aneurysm.   HISTORY:  Diabetes:  No.  Cardiac:  Coronary artery bypass graft in 2000.  Hypertension:  Yes.  Smoking:  Former smoker.  Connective Tissue Disorder:  Family History:  Previous Surgery:  Left carotid endarterectomy, 04/25/08.   DUPLEX EXAM:         AP (cm)                   TRANSVERSE (cm)  Proximal             1.2 cm                    1.1 cm  Mid                  2.8 cm                    2.8 cm  Distal               1.5 cm                    1.7 cm  Right Iliac          0.7 cm                    1.0 cm  Left Iliac           0.5 cm                    0.7 cm   PREVIOUS:  Date:  AP:  TRANSVERSE:   IMPRESSION:  Small abdominal aortic aneurysm is identified.   ___________________________________________  Larina Earthly, M.D.   MC/MEDQ  D:  11/01/2008  T:  11/01/2008  Job:  [id number]

## 2011-01-12 NOTE — Op Note (Signed)
NAMEMarland Hicks  JAALA, BOHLE           ACCOUNT NO.:  [id number]   MEDICAL RECORD NO.:  [id number]          PATIENT TYPE:  INP   LOCATION:  2899                         FACILITY:  [facility]   PHYSICIAN:  Larina Earthly, M.D.    DATE OF BIRTH:  1924/04/12   DATE OF PROCEDURE:  04/25/2008  DATE OF DISCHARGE:                               OPERATIVE REPORT   PREOPERATIVE DIAGNOSIS:  Severe asymptomatic left internal carotid  stenosis.   POSTOPERATIVE DIAGNOSIS:  Severe asymptomatic left internal carotid  stenosis.   PROCEDURE:  Left carotid endarterectomy and bovine pericardium patch  angioplasty.   SURGEON:  Larina Earthly, MD   ASSISTANT:  Jerold Coombe, PA-C   ANESTHESIA:  General endotracheal.   COMPLICATIONS:  None.   DISPOSITION:  To recovery room, stable, neurologically intact.   PROCEDURE IN DETAIL:  The patient was taken to the operating room and  placed in supine position.  The left neck was prepped and draped in  usual sterile fashion.  Incision was made in anterior  sternocleidomastoid and carried down through the platysma with  electrocautery.  Sternocleidomastoid was reflected posteriorly and the  carotid sheath was opened.  The facial vein was ligated with 2-0 silk  ties and divided.  The common carotid artery was encircled with  umbilical tape and Rumel tourniquet.  Dissection was turned onto the  bifurcation.  The superior thyroid artery was circled with 2-0 silk  Potts tie.  The external carotid was circled with a vessel loop.  The  internal carotid was circled with an umbilical tape and Rumel  tourniquet.  The patient was given 7000 units of intravenous heparin.  After adequate circulation time, the internal, external, and common  carotid arteries were occluded.  The common carotid arteries were opened  with 11 blade extending longitudinally using Potts scissors through the  plaque onto the internal carotid artery.  The patient had a relatively  large internal  carotid artery.  The endarterectomy was begun on the  common carotid artery and the plaque was divided proximally with Potts  scissors.  The endarterectomy was taken down to the bifurcation.  The  external carotid was endarterectomized with eversion technique and the  internal carotid was endarterectomized in an open fashion.  Remaining  atheromatous debris was removed from the endarterectomy plane.  A bovine  pericardial patch was brought onto the field, was cut to the appropriate  dimension, was sewn with a 6-0 Prolene suture.  Prior to completion of  the anastomosis, the shunt was removed.  The usual flushing maneuvers  were undertaken.  The anastomosis was completed.  Flow was restored  first to the external then the internal carotid artery.  Excellent flow  characteristics were noted with handheld Doppler.  The patient was given  50 mg of protamine to reverse the heparin. Wounds were irrigated with  saline.  Hemostasis with electrocautery.  Wounds were closed with 3-0  Vicryl to reapproximate the sternocleidomastoid area with the carotid  sheath.  Next,  the platysma was closed with a running 3-0 Vicryl sutures, and finally  the skin was closed with a  4-0 subcuticular Vicryl stitch.  Sterile  dressing was applied.  The patient was awakened in the operating room  neurologically intact and was transferred to the recovery room in stable  condition.      Larina Earthly, M.D.  Electronically Signed     TFE/MEDQ  D:  04/25/2008  T:  04/26/2008  Job:  [id number]

## 2011-01-12 NOTE — Procedures (Signed)
CAROTID DUPLEX EXAM   INDICATION:  Followup carotid artery disease.   HISTORY:  Diabetes:  No.  Cardiac:  CABG.  Hypertension:  Yes.  Smoking:  Previous.  Previous Surgery:  Left CEA 04/25/2008.  CV History:  No.  Amaurosis Fugax No, Paresthesias No, Hemiparesis No                                       RIGHT             LEFT  Brachial systolic pressure:         214               216  Brachial Doppler waveforms:         WNL               WNL  Vertebral direction of flow:        Antegrade         Antegrade  (atypical)  DUPLEX VELOCITIES (cm/sec)  CCA peak systolic                   108               109  ECA peak systolic                   297               343  ICA peak systolic                   229               (mid) 181  ICA end diastolic                   56                48  PLAQUE MORPHOLOGY:                  Calcific          Homogeneous  PLAQUE AMOUNT:                      Moderate          Moderate  PLAQUE LOCATION:                    ICA/ECA/CCA       ECA/ICA (distal to  patch)   IMPRESSION:  1. Right ICA velocities are suggestive of 60-79% stenosis.  2. Left ICA velocities distal to patch are suggestive of 40-59%      stenosis.  3. Bilateral ICA velocities appear fairly stable compared to previous      study.  4. Bilateral ECA stenosis.  5. Bilateral vertebral arteries appear antegrade, however, left      waveform is atypical.   ___________________________________________  Larina Earthly, M.D.   AS/MEDQ  D:  04/11/2009  T:  04/11/2009  Job:  [id number]

## 2011-01-12 NOTE — Assessment & Plan Note (Signed)
[facility]                            CARDIOLOGY OFFICE NOTE   NATAYA, BASTEDO                    MRN:          [id number]  DATE:04/23/2008                            DOB:          01-01-1924    CHIEF COMPLAINT:  I need to have surgery.   HISTORY OF PRESENT ILLNESS:  [name] is a patient well-known to  me.  She is an 77 year old female who has known coronary and carotid  disease.  The patient had prior bypass surgery.  She underwent cardiac  catheterization in 2004, at that time she had a small abdominal  aneurysm, she was known to have a saphenous vein graft to the distal  right coronary artery was patent, patent saphenous vein graft to the OM  and continued patency of a sequential saphenous vein graft to the  diagonals.  The LIMA had two lesions and she underwent successful  stenting using non drug-eluting platforms.  She has continued to remain  stable in the interim, and has not had recurrent symptomatology.  She  has had recurrent carotids in 2007.  She did have an abdominal aortic  aneurysm 2 years ago that was 3 x 2.8 and this has been followed.  She  has had progression of her carotid disease, fortunately with no  symptoms.  She has seen Dr. Tawanna Cooler Early and carotid endarterectomy has  been scheduled.   CURRENT MEDICATIONS:  1. Lasix 40 mg b.i.d.  2. Zetia 10 mg daily  3. Synthroid 0.075 mg daily.  4. Plavix 75 mg daily.  5. Toprol XL 25 mg daily.  6. Allopurinol 300 mg daily.  7. Premarin vaginal cream.  8. Actonel 35 mg weekly.  9. Lipitor 40 mg daily.  10.Klor-Con 20 mEq daily.   ALLERGIES:  INCLUDE ASPIRIN, ERYTHROMYCIN, CODEINE, DARVOCET, LEVAQUIN,  LIBLOMYCIN CONTRAST, PROMETRIUM, NYLON, POLYESTER, CHOCOLATE, OREGANO  AND PEANUTS.   PAST MEDICAL HISTORY:  1. History of right bundle branch block.  2. Degenerative joint disease.  3. Benign breast biopsies.  4. She does not have diabetes mellitus.  5. She has had  coronary artery disease.   SOCIAL HISTORY:  She is married and retired.  She has not been a smoker.  She has adult children.   FAMILY HISTORY:  Negative for colon cancer but is positive for coronary  artery disease.   She denies any fever or chills.  She denies any particular vision loss.  She denies any melena, diarrhea, constipation or other progressive  symptoms.  Her review of systems is otherwise negative.   PHYSICAL:  She is alert and oriented in no acute distress.  Blood  pressure 150/60, pulse is 55 and regular.  There are bilateral carotid  bruits noted.  LUNG:  Fields are actually clear to auscultation and percussion.  CARDIAC:  Rhythm is regular.  There is a soft apical murmur and a soft  systolic ejection murmur as well; the systolic ejection murmur is noted  in left upper sternal edge, but may be transmitted bruit.  EXTREMITIES:  Reveal trace edema.   Electrocardiogram demonstrates sinus bradycardia with right bundle  branch  block.   Recent chest x-ray reveals no active disease and COPD and cardiomegaly.  Recent laboratory studies include a urinalysis with 3-6 white blood  cells, BUN of 45, creatinine 1.34, potassium of 3.8.   Recent carotids with Dr. Arbie Cookey suggest 60%-79% stenosis in the RCA and  80%-90% in the left ICA.   IMPRESSION:  1. Coronary artery disease status post coronary bypass graft surgery      with subsequent percutaneous stenting.  2. Relatively limited daily activity with met level less than 4.  3. Progressive cerebrovascular disease.  4. Abdominal aortic aneurysm.  5. Hyperlipidemia.  6. Advanced age.  7. Right bundle-branch block by EKG.   RECOMMENDATIONS:  1. We will get a 2D echocardiogram.  2. We discussed the possibility of further evaluation including      radionuclide imaging.  The patient has a fever of not proceeding      with the surgery, which is understandable given her progressive      carotid disease.  She has not had any  particular symptoms, or any      high risk symptoms suggested.  Given her age, and her underlying      vascular disease and the fact that she is relatively stable I am      not sure that any finding on radionuclide imaging would change her      overall status unless it would be a high risk test in which case      her surgery would be cancelled.  We discussed these various options      with the patient, and given the fact that there is very little at      the present time that would change our current treatment plan, she      believes that we should proceed with out radionuclide imaging.  She      is at least at moderate risk given her age and underlying cardiac      problems.  Nonetheless, with carotid endarterectomy planned, it      should be a relatively safe procedure.  We will follow her closely      in the hospital.  As an interim, we will get a 2D echo to make sure      that her LV function is well-preserved.     Arturo Morton. Riley Kill, MD, [facility]  Electronically Signed    TDS/MedQ  DD: 04/23/2008  DT: 04/23/2008  Job #: [id number]

## 2011-01-12 NOTE — Discharge Summary (Signed)
NAMEMarland Kitchen  [name], [name]           ACCOUNT NO.:  [id number]   MEDICAL RECORD NO.:  [id number]          PATIENT TYPE:  INP   LOCATION:  3301                         FACILITY:  [facility]   PHYSICIAN:  Larina Earthly, M.D.    DATE OF BIRTH:  April 03, 1924   DATE OF ADMISSION:  04/25/2008  DATE OF DISCHARGE:  04/26/2008                               DISCHARGE SUMMARY   ADMISSION DIAGNOSIS:  Severe asymptomatic left internal carotid artery  stenosis.   SECONDARY DIAGNOSES:  1. Severe asymptomatic left internal carotid artery stenosis, status      post left carotid endarterectomy.  2. Coronary artery disease, status post coronary artery bypass      grafting and subsequent stenting.  3. Hypothyroidism.  4. Hyperlipidemia.  5. Hypertension.  6. History of gout.  7. Left breast cyst removed 30 years ago.  8. Remote history of appendectomy.  9. History of cancer on the nose and had recent skin graft for      postoperative bleeding.  10.History of right bundle-branch block.  11.Small abdominal aortic aneurysm, which has been followed at [facility]      Cardiology from ultrasound in July 2007, it measured 3 x 2.8 cm and      by Dr. Rosalyn Charters note, she did not wish for any procedure to be      done for this.   ALLERGIES:  Multiple allergies including all POLYESTER FABRICS, all  SYNTHETIC SUTURE, IVP DYE ASPIRIN, CODEINE, ERYTHROMYCIN, DARVOCET,  EPINEPHRINE, DOXYCYCLINE, LEVAQUIN, PEANUTS, KEFLEX, OMNICEF.   PROCEDURE:  April 25, 2008, left carotid endarterectomy with bovine  pericardial patch angioplasty by Dr. Gretta Began.   BRIEF HISTORY:  [name] is an 77 year old female with known  carotid artery disease.  She had not been seen in several years for  this.  She had no moderate stenosis and had a carotid duplex in May 2008  suggesting significant disease.  She has remained asymptomatic.  She was  referred to Dr. Arbie Cookey for consideration of surgical intervention.  She  underwent repeat  carotid duplex at the [facility] office on April 05, 2008,  revealing critical stenosis in her left carotid system and moderate  stenosis in her right carotid system.  He recommended that she undergo  elective left carotid endarterectomy to reduce her risk for future  stroke.  With her history of coronary artery disease, she was set up for  cardiac evaluation by Dr. Riley Kill who ultimately cleared her for  surgery.   HOSPITAL COURSE:  [name] was electively admitted to [facility] on April 25, 2008.  She underwent the previously-mentioned  procedure.  Of note, she did have her preoperative urinalysis showed  small amount of leukocytes and microscopic showed many bacteria.  Subsequently, we ordered a followup urinalysis postoperative, which was  completely within normal limits.  The patient was extubated and  neurologically intact, and after short stay in recovery unit, was  transferred to Step-Down Unit at 3300 where she remained until  discharge.  She had an uneventful postoperative course.  Her Foley  catheter was removed the day of surgery per the patient  request and she  was able to void without difficulty.  Her home medications were resumed.  On postoperative day #1, her diet was advanced, arterial line was  discontinued, and activity was advanced as well.  She was able to meet  criteria and was felt appropriate for discharge home on April 26, 2008,  postoperative day #1.  Currently, she remains in stable condition.  Her  postoperative labs show sodium of 140, potassium 3.8, chloride 109, CO2  25, blood glucose 122, BUN of 39, creatinine of 1.48 (baseline  creatinine of 1.34).  Her white count was 10.3, hemoglobin 9, hematocrit  27.1, and platelet count 140.   DISCHARGE MEDICATIONS:  1. Plavix 75 mg daily.  2. Klor-Con 20 mEq q.a.m.  3. Synthroid 0.75 mg q.a.m.  4. Toprol 25 mg at 10 a.m.  5. Allopurinol 300 mg at 10 a.m.  6. Lasix 80 mg one half tablet at 10 a.m. and  another half tablet at      7:30 p.m.  7. Imdur 30 mg at 10 a.m.  8. Lipitor 40 mg at 7:30 p.m.  9. Zetia 10 mg at 7:30 p.m.  10.Premarin vaginal cream once per week on Tuesdays.  11.Actonel 35 mg once per week of Saturdays.  12.Mucinex 600 mg b.i.d. p.r.n.  13.Ultram 50 mg 1 tablet p.o. q.4 h. p.r.n. pain.   DISCHARGE INSTRUCTIONS:  She is to follow a heart-healthy diet.  Increase activity as tolerated.  Avoid driving or heavy lifting for the  next 2 weeks.  May shower and clean her incisions gently with soap and  water, should call if she develops fever greater than 101, redness or  drainage from her incision sites or severe headache or neurologic  changes.  She will see Dr. Arbie Cookey the [facility] on May 10, 2008 at  11:15 a.m. and may call sooner if needed.      Jerold Coombe, P.A.      Larina Earthly, M.D.  Electronically Signed    AWZ/MEDQ  D:  04/26/2008  T:  04/27/2008  Job:  [id number]   cc:   Arturo Morton. Riley Kill, MD, [facility]

## 2011-01-12 NOTE — Assessment & Plan Note (Signed)
OFFICE VISIT   [name], [name]  DOB:  Aug 23, 1924                                       05/10/2008  [id number]#:[id number]   The patient presents today for followup of her left carotid  endarterectomy and bovine pericardial angioplasty on 04/25/2008.  She  did well and was discharged home on postoperative day 1.  She has had no  postoperative difficulties and has some mild discomfort.  She has had no  neurologic deficits.   On physical examination her neck incision is well-healed.  She is  grossly intact neurologically.  She will continue her usual activity.  We plan to see her again in 6 months with repeat carotid duplex at that  time.   Larina Earthly, M.D.  Electronically Signed   TFE/MEDQ  D:  05/10/2008  T:  05/10/2008  Job:  [id number]   cc:   Dr Barney Drain

## 2011-01-12 NOTE — Letter (Signed)
October 18, 2008    Dr. Foye Deer  [address]  [place], [place]  [address]   RE:  Kristina, Hicks  MRN:  [id number]  /  DOB:  [birth year]   Dear Gala Romney,   I had the pleasure seeing [name] in the office today in  follow-up.  Cardiac-wise, she is doing reasonably well.  She has had  some leg cramps recently.  They have been fairly bad for last couple of  weeks.  Her blood pressures have also been somewhat elevated, and she  tells me that she was in your office recently, and you recommended to  increase her Toprol to 50 mg a day.  When she got home, she realized she  was taking 50 mg a day already, and she did not end up calling your  office back to notify you of that.  The patient also is complaining of  some chronic dizziness that sort of comes and goes.  As you know, she  had a carotid endarterectomy done by Dr. Gretta Began, and she is  scheduled to see Tawanna Cooler in the next two weeks.  I have spoken with Tawanna Cooler in  some detail specifically because of her history of subclavian disease,  her carotid disease, and also the fact she has an abdominal aortic  aneurysm.  We had stopped doing follow-up studies on her as she felt  that she would never be in a position to want to have surgery, but I  also notified Tawanna Cooler that they might want to add this to the ultrasound  studies that they are planning to do.   The leg cramps have been at least moderate since the last 3-4 weeks.   MEDICATIONS:  1. Lasix 40 mg b.i.d.  2. Synthroid 0.075 mg daily.  3. Plavix 75 mg daily.  4. Allopurinol 3 mg daily.  5. Actonel 35 mg daily.  6. Imdur 30 mg daily.  7. Lipitor 40 mg q.h.s.  8. Klor-Con 20 mEq daily.  9. Toprol XL 50 mg daily.  10.Premarin vaginal cream.   PHYSICAL EXAMINATION:  GENERAL:  She is alert and oriented in no  distress.  VITAL SIGNS:  Blood pressure in the right arm is 180-190/70-80, in the  left arm it is about 200-210.  She has widespread bruits in  the upper  chest.  She has a left carotid bruit just over the incision site.  The  pulse is 58.  LUNGS:  Lung fields are otherwise clear.  CARDIAC:  Rhythm is regular.  There is a systolic ejection murmur, but  this may could represent radiated vascular disease.  Electrocardiogram  demonstrates sinus bradycardia with right bundle-branch block.  EXTREMITIES:  Her lower extremities demonstrate no definite edema.  There is perhaps slightly diminished pulses.   ASSESSMENT:  I spoke with Tawanna Cooler early today.  I suspect she should have a  comprehensive vascular exam when she is seen over there for her carotid  Dopplers.  This could include evaluation of her subclavians, her  vertebrals, the abdominal aortic, and perhaps distal vasculature.  In  addition, we have added amlodipine 5 mg to her regimen.  I explained to  her that it could cause some lower extremity swelling, and with her  history of subclavian steal, there is always potential that additional  medication could make this worse.  I have asked her to have lab work  done in your office next week.  I have also asked her to make a  follow-  up appointment with you.  I suspect her electrolytes and magnesium  should be checked as well as lipids and possibly even CPK.   If she has any problems with the medicines before she sees you, she is  to contact us promptly.  Cardiac-wise, she has rare chest discomfort and  it has not been progressive, so we will see her as needed.  I appreciate  the opportunity of sharing in her care.    Sincerely,      Arturo Morton. Riley Kill, MD, [facility]  Electronically Signed    TDS/MedQ  DD: 10/18/2008  DT: 10/18/2008  Job #: [id number]   CC:    Larina Earthly, M.D.

## 2011-01-12 NOTE — Procedures (Signed)
CAROTID DUPLEX EXAM   INDICATION:  Follow up of carotid disease.   HISTORY:  Diabetes:  No  Cardiac:  CABG  Hypertension:  Yes  Smoking:  Previous  Previous Surgery:  Left CEA April 25, 2008  CV History:  Currently asymptomatic  Amaurosis Fugax, Paresthesias, Hemiparesis                                       RIGHT             LEFT  Brachial systolic pressure:         180               170  Brachial Doppler waveforms:         Triphasic         Triphasic  Vertebral direction of flow:        Antegrade         Atypical  DUPLEX VELOCITIES (cm/sec)  CCA peak systolic                   95                139 (D)  ECA peak systolic                   143               231  ICA peak systolic                   245               175  ICA end diastolic                   58                48  PLAQUE MORPHOLOGY:                  Mixed             Mixed  PLAQUE AMOUNT:                      Moderate to severe                  Moderate  PLAQUE LOCATION:                    Bifurcation, ICA, ECA               CCA, ICA, ECA, vertebral   IMPRESSION:  1. A 60%-79% stenosis noted in the right internal carotid artery.  2. A 40%-59% stenosis noted in the left internal carotid artery.  3. Left external carotid artery and common carotid artery stenosis.  4. Left vertebral atypical Doppler waveform combined with a left      subclavian velocity of 323 cm/sec.   ___________________________________________  Larina Earthly, M.D.   CJ/MEDQ  D:  10/10/2009  T:  10/10/2009  Job:  [id number]

## 2011-01-12 NOTE — Procedures (Signed)
CAROTID DUPLEX EXAM   INDICATION:  Follow-up evaluation of known carotid artery disease.   HISTORY:  Diabetes:  No.  Cardiac:  Coronary artery bypass graft in 2000.  Hypertension:  Yes.  Smoking:  Former smoker.  Previous Surgery:  Left carotid endarterectomy with pericardium patch  angioplasty in 04/25/08.  CV History:  Patient reports no cerebrovascular symptoms at this time.  Amaurosis Fugax No, Paresthesias No, Hemiparesis No.                                       RIGHT             LEFT  Brachial systolic pressure:         210               218  Brachial Doppler waveforms:         Triphasic         Triphasic  Vertebral direction of flow:        Antegrade         Antegrade atypical  DUPLEX VELOCITIES (cm/sec)  CCA peak systolic                   127               127  ECA peak systolic                   329               319  ICA peak systolic                   213               186  ICA end diastolic                   46                36  PLAQUE MORPHOLOGY:                  Calcified         Soft  PLAQUE AMOUNT:                      Moderate          Moderate  PLAQUE LOCATION:                    Proximal ICA/ECA  Distal patch/ICA   IMPRESSION:  1. Proximal left subclavian artery peak systolic velocity 323 cm/s.      This along with atypical left vertebral artery flow suggests left      subclavian artery stenosis with early vertebral steal.  2. Bilateral external carotid artery stenosis.  3. 40-59% right internal carotid artery stenosis.  4. 20-39% left internal carotid artery stenosis, status post      endarterectomy.   ___________________________________________  Larina Earthly, M.D.   MC/MEDQ  D:  11/01/2008  T:  11/01/2008  Job:  [id number]

## 2011-01-12 NOTE — Procedures (Signed)
CAROTID DUPLEX EXAM   INDICATION:  Followup carotid artery disease.   HISTORY:  Diabetes:  No.  Cardiac:  CABG.  Hypertension:  Yes.  Smoking:  Previous.  Previous Surgery:  Left CEA 04/25/2008.  CV History:  Asymptomatic.  Amaurosis Fugax Yes No, Paresthesias Yes No, Hemiparesis Yes No                                       RIGHT             LEFT  Brachial systolic pressure:         162               148  Brachial Doppler waveforms:         Within normal limits                Within normal limits  Vertebral direction of flow:        Antegrade         Atypical  DUPLEX VELOCITIES (cm/sec)  CCA peak systolic                   82                103  ECA peak systolic                   199               285  ICA peak systolic                   262               191  ICA end diastolic                   63                47  PLAQUE MORPHOLOGY:                  Heterogeneous     Smooth  PLAQUE AMOUNT:                      Moderate          Minimal  PLAQUE LOCATION:                    CCA, ICA, ECA     Intimal wall   IMPRESSION:  1. Right internal carotid artery velocities suggest 60%-79% stenosis.  2. Left internal carotid artery velocities suggest a 40%-59% stenosis.  3. Left external carotid artery stenosis noted.   ___________________________________________  Larina Earthly, M.D.   EM/MEDQ  D:  05/29/2010  T:  05/29/2010  Job:  [id number]

## 2011-01-12 NOTE — Assessment & Plan Note (Signed)
OFFICE VISIT   [name], [name]  DOB:  1924/06/13                                       11/01/2008  [id number]#:[id number]   The patient presents today for 62-month followup of her left carotid  endarterectomy and patch angioplasty on 04/25/2008.  She has done well  since her surgery and has had no neurologic deficits.  She also remains  stable from a cardiac and general medical standpoint.  She is having a  difficult time with a young family member who is quite anxious regarding  this.  Her blood pressure is up today and she feels that this may be the  cause.  She also has a questionable history of prior aneurysm and  underwent ultrasound to follow up this today as well.  She also  underwent carotid duplex followup.  She does remain on Plavix 75 mg a  day.   PHYSICAL EXAM:  Well-developed, well-nourished white female appearing  stated age of 77.  Blood pressure is 206/71 left arm, 202/64 right arm,  67 pulse, respirations 18.  Her left carotid incision is well-healed.  She does have a harsh left carotid bruit.  No bruit on the right.  Her  radial pulses are 2+.  She does have 2+ dorsalis pedis pulses  bilaterally.   On carotid duplex scan, her endarterectomy site is patent with no  evidence of significant stenosis.  She does have 2+ stenosis in her left  external carotid.  I discussed this with the patient, explaining the  etiology of her bruit is of no clinical consequence.  She does have  external carotid stenosis on the right side as well, and does have a  moderate right internal carotid artery stenosis.  She underwent  ultrasound of her aorta showing a maximal diameter of 2.8 cm.  I have  recommended that we see her again in 6 months with repeat carotid duplex  in 1 year from her endarterectomy, and then drop back to yearly duplex  followup.  I would not recommend any further ultrasound of her aorta  since she has mild ectasia and no evidence of an  aneurysm at age 42.  We  will see her again in 6 months.  She will notify us should she develop  any neurologic deficits.   Larina Earthly, M.D.  Electronically Signed   TFE/MEDQ  D:  11/01/2008  T:  11/04/2008  Job:  [id number]   cc:   Dr. Charlynn Court. Riley Kill, MD, [facility]

## 2011-01-15 NOTE — Assessment & Plan Note (Signed)
[facility]                              CARDIOLOGY OFFICE NOTE   [name], [name]                    MRN:          [id number]  DATE:03/22/2006                            DOB:          25-Sep-1923    [name] is in for a followup visit.  In general, she has continued to  get along reasonably well.  The biggest problem has been that she developed  what sounds like an antibiotic-induced diarrhea and then had to have therapy  for that.  She recently has had followup on her ultrasound imaging.  She has  an abdominal aortic aneurysm of 3 x 2.8.  She has a moderate aortic flow  reduction with severe stenosis of the bilateral proximal common ileac  arteries.  The velocities are a little bit higher.  She has had no change in  her feet.  She does have discomfort in the mid-back.  This has been ongoing.  She has a history of contrast allergy.  On her examination today, she is an  alert, oriented female.   CURRENT MEDICATIONS:  1.  Synthroid 0.075 mg daily.  2.  Potassium 20 mEq daily.  3.  Plavix 75 mg daily.  4.  Toprol 25 mg daily.  5.  Allopurinol 300 mg daily.  6.  Lasix 80 mg daily.  7.  Allegra 180 mg daily.  8.  Imdur 30 mg daily.  9.  Lipitor 40 mg daily.  10. Zetia 10 mg daily.  11. Premarin vaginal cream.  12. Actonel 35 mg weekly.  13. Mucinex one per day.   PHYSICAL EXAMINATION:  GENERAL:  She actually is alert and oriented.  VITAL SIGNS:  The blood pressure is 140/60 in the left arm.  The pulse is  58.  LUNGS:  Fields are clear.  CARDIAC:  Rhythm is regular.  There is systolic ejection murmur, but it is  actually probably transmitted vascular sounds from the upper left sternal  edge, radiating into the subclavian areas.  ABDOMEN:  Soft.  There is perhaps a minimal wide pulsation.  There are bi-  femoral bruits noted.  There are diminished but present pulses in the lower  extremities.   The electrocardiogram demonstrates  normal sinus rhythm with right bundle  branch block and nonspecific ST-T wave abnormalities.   I have had an extensive discussion with this nice lady.  She wants the  minimal amount as done as possible.  She says with all the vascular disease  that she has had, she would prefer to take a conservative course of  management.  We have been following her abdominal aortic aneurysm.  She also  had carotid studies done back in April of this year, and she has a 60-79%  RICA stenosis and a 60-79% LICA stenosis.  There was increased flow  reduction in the left bulb.  There is some bilateral subclavian stenoses,  right greater than left.  There is elevated vertebral artery velocities  bilaterally.   We had talked about the possibility of doing a CTA, but importantly, the  patient has a history of contrast allergy  and appropriately wants to defer  this.  With regard to this, we could consider doing an MRA.  I will discuss  this with the radiologist.  She will see [place] back in six months.  Her general  preference would be to defer any kind of evaluation if possible.                              [name]. Kristina Kill, MD, [facility]    TDS/MedQ  DD:  03/22/2006  DT:  03/22/2006  Job #:  [id number]   cc:   Barney Drain, M.D.

## 2011-01-15 NOTE — Cardiovascular Report (Signed)
NAME:  [name], [name]                     ACCOUNT NO.:  [id number]   MEDICAL RECORD NO.:  [id number]                   PATIENT TYPE:  OIB   LOCATION:  2037                                 FACILITY:  [facility]   PHYSICIAN:  [name], M.D.             DATE OF BIRTH:  Jul 03, 1924   DATE OF PROCEDURE:  04/01/2003  DATE OF DISCHARGE:  04/03/2003                              CARDIAC CATHETERIZATION   INDICATIONS FOR PROCEDURE:  [name] is well known to [place]. She has a  history of known  coronary artery disease. She underwent  cardiac  catheterization in 2000 by Dr. Chales Abrahams and  had a left main dissection. She  went for emergency bypass surgery. She has done well but recently developed  some recurrent chest pain. A Cardiolite revealed  some anterior defect. She  had a cardiac catheterization which demonstrated patent vein graft to 2  diagonal branches, a patent mammary to the distal  LAD with 2 high-grade  stenoses, mild ostial narrowing of a vein graft to an  OM and continued  patency of her vein graft to the posterior descending artery.   We have discussed  the various options in detail and she was brought back to  the laboratory  today  to take another view of the left anterior descending  system and also review the internal mammary for possible percutaneous  intervention. We talked about the possibility of dilating the native and  also about the possibility of dilating the 2 lesions in the internal  mammary.   PROCEDURE PERFORMED:  1. Selective coronary arteriography.  2. Percutaneous stenting of the internal mammary x2 using a 2.25 x 18 and a     2.25 x 13 Pixel stents.   DESCRIPTION OF PROCEDURE:  The patient was brought to the catheterization  laboratory  and prepped and draped in the usual fashion. She had been given  Plavix prior to the procedure. Bivalirudin was given as the anticoagulant of  choice.   We used a guiding catheter to obtain further views of the  left anterior  descending artery. This reveals about  a 70% lesion of the mid left anterior  descending artery. The distal LAD is somewhat diffusely irregular and tapers  distally into a small caliber vessel which appears to have some diffuse  disease. The internal mammary does demonstrate bidirectional flow and there  are 2 high-grade lesions of 80% to 90% in the internal mammary and the  vessel was a somewhat small caliber vessel.   In careful review of the old films, it appeared  that the LAD lesion was  stable. Given the situation we felt likely  the best option might be to  dilate and put stents in the 2 high-grade lesions in the internal mammary to  try to preserve this conduit should there develop problem in the native  vessel.   The patient was adequately anticoagulated. A 6 [place]  internal  mammary  guide was utilized and the lesion was crossed with a Hi-Torque floppy wire.  Predilations were performed using a 2-mm x 10 Cross Sail balloon. We then  dilated the distal lesion with a 2.25 x 15 Cross Sail balloon. The distal  lesion  was subsequently stented using an 18-mm 2.25 Pixel. The proximal  lesion was stented using a 2.25, 13 Pixel. As the vessel's diameter was  around 2.1 to 2.2, these were done with approximately 11 atmospheres of  pressure.   There was marked improvement in the appearance of both lesions with  excellent angiographic appearance. However, there continued to be  bidirectional flow in the internal mammary graft, suggesting the possibility  that the mid LAD stenosis may not be flow limiting. The procedure was  completed and all catheters were subsequently removed.   ANGIOGRAPHIC DATA:  The left main is short. The LAD has some luminal  irregularity prior to the septal perforator and through the first 2 septal  perforators. There is then a diagonal that is noted to fill a little bit  bidirectionally and is probably supplied by the previously noted vein  graft.  The LAD proper has a 70% segmental stenosis prior to the takeoff of the  internal mammary. There is an approximate 90 degree bend right at the  takeoff of the internal mammary and it is difficult to tell whether there is  a stenosis leading into the mammary in the LAD before the end-to-side  anastomosis into the distal  LAD. The distal  LAD itself is a somewhat  diffusely diseased vessel as noted which tapers out at the apex. There is  some retrograde filling of the internal mammary as noted, and on the  previous study it appeared  to fill up to the 2 lesions.   Following the stenting of the proximal internal mammary stenosis of 90%,  this was reduced to less than 10% and likewise the mid stenosis was reduced  to less than 10% as well. However, the flow remained bidirectional in the  internal mammary as previously noted after the above stenoses were relieved.  However, the lesions themselves appeared  to be widely patent. There also  appears to be some narrowing of probably 50% involving the subclavian on the  left.   CONCLUSIONS:  1. Successful stenting of the 2 high-grade lesions of the internal mammary     artery as noted above.  2. A 70% stenosis of the mid left anterior descending artery prior to the     internal mammary with a probable noncritical stenosis just prior to the     insertion of the mammary in the native LAD with diffuse apical LAD     disease as noted above.   DISPOSITION:  As I explained to the family in detail, we were uncertain as  to the best course of action. The patient has an anterior defect which I  think was likely  related to the LAD territory, but it is hard to be  certain. She clearly had 2 high-grade lesions in the internal mammary  leading into the distal  LAD and bidirectional flow in this area. The LAD  itself does not appear to be greatly progressed from the preoperative film  in the midportion of the vessel.  With the retrograde filling  of the LAD, I was a little reluctant to go ahead  and dilate this, as this overlies a number of septal perforators and the  area does not appear to  be dramatically progressed. With the 2 high-grade  lesions in the internal mammary we felt that this was probably the best  approach to try to preserve the  mammary if it can be preserved for the long  haul. However, there does not appear to be antegrade flow down the LAD,  probably because of better  flow down the native vessel. Hopefully the  patient will stabilize with this and we will try to treat her with nitrates  at the present time. Close follow up with me in the office will be required.                                               [name], M.D.    TDS/MEDQ  D:  04/06/2003  T:  04/06/2003  Job:  [id number]   cc:   Barney Drain, M.D.

## 2011-01-15 NOTE — Assessment & Plan Note (Signed)
[facility]                            CARDIOLOGY OFFICE NOTE   LUCIANN, GOSSETT                    MRN:          [id number]  DATE:09/06/2006                            DOB:          09/22/1923    PATIENT:  [name].   SUBJECTIVE:  Ms. Pickelsimer is in today for a follow-up visit.  She has  said fairly clearly that she wants to not have any further studies done  in general, unless she has an ongoing life-threatening illness and would  prefer to avoid a preventative evaluation such as ultrasound and  Dopplers.  Her major complaint has been discomfort up through the  superior aspect of the back and shoulders.  She has had a very rare to  occasional episode of chest pain.   The patient last underwent a catheterization and intervention in August  2004.  At that time she had a percutaneous intervention of a vein graft  to the diagonal system, and had two stents placed in the internal  mammary.  She has remained stable from a cardiac standpoint since that  time, but has had other notable findings.  In July of this year, she has  evidence of an abdominal aortic aneurysm with a dimension of 3 x 2.8,  with moderate aortic flow reduction and severe stenosis of bilateral  proximal common iliac arteries, with some increase in velocities.  We  contacted her at that time, and she really did not want to have any type  of procedure done.   The patient also has carotid disease, and in April of last year  underwent a carotid evaluation, which revealed mildly elevated  velocities of the right internal carotid from the previous study and an  estimate of stenosis of 60%-79%.  She also had at least 60%-79% LICA and  some bilateral subclavian stenosis, right greater than left.  She has  declined evaluation since that time.   Her current symptoms seem mostly in the upper back and in the shoulders,  with extension up into the neck.   PHYSICAL  EXAMINATION:  VITAL SIGNS:  On examination today the blood  pressure is 160/66, pulse 60.  LUNGS:  Lung fields are clear.  HEART:  Rhythm is regular.  Bilateral carotid bruits are noted, as well  as subclavian bruits.  ABDOMEN:  Does not reveal a markedly enlarged bulge.   The electrocardiogram demonstrates normal sinus rhythm with a right  bundle branch block and a suggestion of right ventricular hypertrophy.  Of note, a previous chest x-ray had revealed normal heart size with  hyper-inflated lungs and central para-bronchial thickening with a  pattern that was consistent with chronic obstructive pulmonary disease.   IMPRESSION:  1. Coronary artery disease, prior coronary artery bypass graft surgery      for left main dissection in 2000, and subsequent percutaneous      intervention, stable.  2. Upper back and neck discomfort, question musculoskeletal etiology.  3. Hypothyroidism.  4. Hypertension.  5. Widespread peripheral vascular disease.  6. History of multiple drug allergies.  7. Abdominal aortic aneurysm.  8. History of  peripheral vascular disease with right iliac dissection      noted at catheterization.   PLAN:  The patient wants the most minimal amount of studies done as  possible.  She is 77 years old.  I did tell her that we could go ahead  and do a cervical spine series, and that this might in fact demonstrate  findings compatible with her symptoms.  We talked about the evaluation  further, a cardiac evaluation, and I told her that I could not  absolutely be sure that her symptoms were not related to underlying  cardiac or aortic disease.  She understandably has fear of invasive  cardiac procedures, having had a left main dissection in 2000 during a  catheterization with Dr. Veneda Melter.  We will get the cervical spine  films, and I will discuss her situation with Dr. Tomasa Blase.     Arturo Morton. Riley Kill, MD, [facility]  Electronically Signed    TDS/MedQ  DD: 09/13/2006   DT: 09/13/2006  Job #: [id number]   cc:   Nadine Counts

## 2011-01-15 NOTE — Discharge Summary (Signed)
NAME:  [name], [name]                     ACCOUNT NO.:  [id number]   MEDICAL RECORD NO.:  [id number]                   PATIENT TYPE:  OIB   LOCATION:  2037                                 FACILITY:  [facility]   PHYSICIAN:  Arturo Morton. Riley Kill, M.D.             DATE OF BIRTH:  [birth year]   DATE OF ADMISSION:  03/29/2003  DATE OF DISCHARGE:  04/03/2003                           DISCHARGE SUMMARY - REFERRING   PROCEDURES:  1. Cardiac catheterization.  2. Coronary arteriogram.  3. Left ventriculogram.  4. Graft angiogram.  5. Left internal mammary artery arteriogram.  6. Percutaneous transluminal coronary angioplasty and stent x2 to one     vessel.  7. Groin ultrasound.   HOSPITAL COURSE:  [name] is a 77 year old female with known coronary  artery disease. She is generally followed by Dr. Tomasa Blase in [place] for  primary care and seen by cardiology in [place]. She had acute disruption  of the left main and LAD during a diagnostic catheterization in 2000, and  went to emergency bypass surgery where she had LIMA to LAD, SVG to D1 and  D2, and SVG to distal circumflex as well as SVG to RCA. She had had some  chest pain, and Dr. Tomasa Blase had a Cardiolite performed which showed anterior  ischemia, new from 2002. She was seen in the office and evaluated and  scheduled for admission for catheterization.   [name] had a catheterization on July 30, which showed patent grafts  except for a 30-40% proximal stenosis in the SVG to distal circumflex, and  the LIMA to LAD had two 90% stenosis. She had a small AAA and right iliac  disease as mentioned as well. She had mild MR on her left ventriculogram  with normal function. Please see the catheterization report for full  details. It was felt that she needed PTCA of the internal mammary artery x2,  and it was felt that this should be a staged procedure secondary to dye load  and a dye allergy. This was scheduled for April 01, 2003.   [name] was treated precatheterization for both procedures with  steroids. She had PCI of the LIMA with two Pixel stents reducing the  stenoses from 90% each to 20% and 0% respectively. Angiomax was used.   On April 02, 2003, her potassium was slightly low at 3.3, and her white  count was elevated, but this was felt secondary to steroids. She was  ambulated and held overnight. The next day, her potassium was 3.7, and renal  function was within normal limits. Her Lasix was decreased to 40 mg p.o.  daily.  Potassium was left unchanged because in the office her potassium was  slightly low at 3.3. [name] was evaluated by Dr. Riley Kill and  considered stable for discharge on April 03, 2003, with outpatient followup  arranged.   LABORATORY DATA:  Hemoglobin 12.3, hematocrit 36.0, WBC 16.4, platelets 240.  Sodium 143, potassium  3.7, chloride 113, CO2 26, BUN 19, creatinine 1.1,  glucose 103.   Chest x-ray:  The heart size is normal. Prominent epicardial fat pad noted.  The lungs are hyperinflated and bronchovascular markings are diffusely  prominent with central peribronchial thickening consistent with COPD.  Biapical pleural and parenchymal scarring are unchanged. The lungs are,  otherwise, clear.   DISCHARGE CONDITION:  Improved.   DISCHARGE DIAGNOSES:  1. Unstable anginal pain, status post cardiac catheterization this admission     with percutaneous coronary intervention of the left internal mammary     artery to the left anterior descending artery.  2. Status post aortocoronary bypass surgery in 2000, with left internal     mammary artery to left anterior descending artery, saphenous vein graft     to right coronary artery, saphenous vein graft to diagonal-1 and diagonal-     2.  3. Preserved left ventricular function by catheterization this admission.  4. Small abdominal aortic aneurysm and right iliac disease by     catheterization this admission.  5.  Hyperlipidemia.  6. Hypertension.  7. Hypothyroidism.  8. Peripheral vascular disease.  9. History of bradycardia.  10.      History of renal insufficiency.  11.      History of gout.  12.      History of hair loss.  13.      History of bilateral carotid bruits followed by Dr. Arbie Cookey.  14.      Allergies to medications including ASPIRIN, ERYTHROMYCIN, CODEINE,     DARVOCET, LEVAQUIN, LIBLOMYCIN, IV CONTRAST, PROMETRIUM, NYLON,     POLYESTER, CHOCOLATE, OREGANO, CUMIN.  15.      Status post breast biopsy for benign tumor.  16.      History of arthritis.  17.      Occasional incontinence and nocturia.  18.      History of constipation.   DISCHARGE INSTRUCTIONS:  Activity level was to include no driving for two  days and no strenuous activity for a week. She is to stick to a low-fat  diet. She is to call the office with problems with the catheterization site.  She is to follow up with Dr. Tomasa Blase as needed. She is to follow up with Dr.  Riley Kill on August 25, at 9:45.   DISCHARGE MEDICATIONS:  1. Synthroid 75 mcg daily.  2. K-Dur 10 mEq daily.  3. Plavix 75 mg daily.  4. Lasix 40 mg daily.  5. Allopurinol 300 mg daily.  6. Allegra 180 mg daily.  7. Lipitor 40 mg daily.  8. Vitamin E 400 International Units daily.  9. Nitroglycerin 0.4 mg p.r.n.  10.      Toprol-XL 25 mg daily.  11.     Actonel every week.  12.      Imdur 30 mg daily.   She is to get a BMET at the followup office visit to assess the adequacy of  her potassium supplementation.      [name], P.A.-C  [facility]           Arturo Morton. Riley Kill, M.D.    RRG/MEDQ  D:  04/03/2003  T:  04/03/2003  Job:  [id number]   cc:   Jonetta Speak., M.D., [facility]

## 2011-01-15 NOTE — Cardiovascular Report (Signed)
NAME:  [name], [name]                     ACCOUNT NO.:  [id number]   MEDICAL RECORD NO.:  [id number]                   PATIENT TYPE:  OIB   LOCATION:  2037                                 FACILITY:  [facility]   PHYSICIAN:  [name], M.D.             DATE OF BIRTH:  04-02-1924   DATE OF PROCEDURE:  03/29/2003  DATE OF DISCHARGE:  04/03/2003                              CARDIAC CATHETERIZATION   INDICATIONS FOR PROCEDURE:  [name] is a delightful 77 year old well  known to me. She underwent previous percutaneous Stenting of the right  coronary artery in 1997 and then underwent diagnostic catheterization in  2000. The patient had a coronary dissection at that time. Went for emergency  bypass surgery. She subsequently has done well but recently has had some  chest pain and an anterior defect by radionuclide imaging. Because of the  recurrent and frequent chest pain, she was brought back to the lab for  further evaluation.   PROCEDURE:  1. Left heart catheterization.  2. Selective coronary angiography.  3. Selective left ventriculography.  4. Saphenous vein graft angiography x3.  5. Selective left internal mammary angiography x1.  6. Distal aortography without runoff.   DESCRIPTION OF PROCEDURE:  The patient was pre-treated and brought to the  catheterization laboratory with pre treatment for contrast allergy.  Appropriate hydration was also given. She was brought to the catheterization  lab and prepped and draped in the usual fashion. Through an anterior  puncture, the femoral artery was entered. Ventriculography was performed in  the RAO projection. Distal aortography was performed with a pigtail  catheter. Views of the coronary arteries were obtained with standard Judkins  catheters and the right coronary catheter was used to engage the vein  grafts. The internal mammary was also engaged with an RCA catheter. The  patient tolerated the procedure well and was  taken to the holding area in  satisfactory clinical condition.   HEMODYNAMIC DATA:  1. Central aorta 186/63. Mean 114.  2. Left ventricular 156/22.  3. No aortic to left ventricular gradient on pull-back across the aortic     valve.   ANGIOGRAPHIC DATA:  1. Ventriculography was performed in the RAO projection. Overall systolic     function was vigorous. There was some ectopy and there appeared to be at     least some mild mitral regurgitation. However, it was somewhat difficult     because of the ectopy, to be certain.  2. Distal aortography revealed what the previous study had demonstrated,     which was a small aneurysmal bulge at the distal most aspect of the     aorta. There was diffuse atherosclerotic change of the aorta, that does     not appear to be flow limiting in the infrarenal area. The renal arteries     are difficult to see but appear to be reasonably patent but accurate     common __________  could not be made about the renal arteries on this     study.  3. The right coronary artery, the previously placed Stent is diffusely     narrow throughout. There is evidence of bi-directional flow distally. The     saphenous vein graft to the distal right coronary inserts into the     posterior descending. This fills the PDA and then fills retrograde into     the posterolateral system. This appears to be widely patent.  4. The left main coronary artery is small. There does not appear to be a     focal dissection at the present time.  5. The LAD demonstrates about a 70% mid stenosis after several septal     perforators. It provides several more septal perforators and then     provides the distal LAD. The distal LAD beyond the graft insertion     appears to be small and probably diffusely diseased. Importantly, the     internal mammary graft appears to fill retrograde through the LAD proper.     There also is a potential stenosis just prior to the graft insertion but     that does  not appear to be critical.  6. The circumflex provides a tiny first marginal that is irregular but quite     small. There is a 75% stenosis in the circumflex, leading into the large     marginal, which trifurcates. There is evidence of competitive filling of     this, compatible with a graft. The saphenous vein graft to the OM does     appear to be widely patent. However, there is damping with the right     catheter in the ostium, although the ostium itself does not appear to be     critically narrow. It appears to be just about slightly larger than the     size of the diagnostic catheter, perhaps accounting for the damping.  7. There is a saphenous vein graft to 2 diagonal branches that is sequential     graft. The diagonal itself is seen as a thin, diffusely diseased vessel     coming off the native LAD, but there is noted competitive filling     proximally and the distal vessel is not seen on the original antegrade     injection. The saphenous vein graft appears to be smooth throughout and     both insertions appear to be really quite good. It is difficult to be     absolutely certain about the second insertion, but the insertions into     the two distal diagonal branches appear to be adequate and there is no     area of high grade focal narrowing. Likewise, there is no obvious     evidence of ostial narrowing.  8. The internal mammary to the distal portion of the left anterior     descending artery has a 95% stenosis proximally. Just after the stenosis,     there is bi-directional filling. There is evidence of a second high grade     stenosis also in the mid portion of the graft. The graft itself fills     retrograde by injection of the native LAD.   CONCLUSION:  1. Well preserved overall left ventricular function.  2. Infrarenal atherosclerosis with small abdominal aneurysm as noted     previously.  3. Continued patency of the saphenous vein graft to the distal right    coronary.   4.  Continued patency of the saphenous vein graft to the obtuse marginal with     mild ostial tapered narrowing, which is likely just graft size.  5. Continued patency of what appears to be a sequential saphenous vein graft     to diagonal branches.  6. Moderate disease of the left anterior descending artery with what appears     to be two high grade lesions in the internal mammary to the LAD.   DISPOSITION:  The patient has an anterior defect. This could be due to the  two high grade lesions in the mammary, or could be possibly be due to  diffuse distal disease. We could possibly dilate the native vessel in the  proximal portion or perhaps even dilate both lesions in the mammary artery,  with the hope that there will be improved antegrade flow through the  mammary. I will review these with my colleagues before making a final  decision. We will bring her back to the lab on Monday with pre-treatment.                                               [name], M.D.    TDS/MEDQ  D:  04/06/2003  T:  04/06/2003  Job:  [id number]   cc:   Foye Deer, M.D.   [facility]

## 2011-03-04 ENCOUNTER — Encounter: Payer: Self-pay | Admitting: Cardiology

## 2011-10-04 ENCOUNTER — Encounter: Payer: Self-pay | Admitting: Cardiology

## 2012-01-21 ENCOUNTER — Encounter: Payer: Self-pay | Admitting: Cardiology

## 2012-03-28 ENCOUNTER — Encounter: Payer: Self-pay | Admitting: Vascular Surgery

## 2012-05-24 ENCOUNTER — Telehealth (INDEPENDENT_AMBULATORY_CARE_PROVIDER_SITE_OTHER): Payer: Self-pay

## 2012-05-24 NOTE — Telephone Encounter (Signed)
Left message for [facility] @ Dr. Tomasa Blase office requesting medical records

## 2012-05-25 ENCOUNTER — Encounter (INDEPENDENT_AMBULATORY_CARE_PROVIDER_SITE_OTHER): Payer: Self-pay | Admitting: General Surgery

## 2012-05-25 ENCOUNTER — Ambulatory Visit (INDEPENDENT_AMBULATORY_CARE_PROVIDER_SITE_OTHER): Payer: Medicare Other | Admitting: General Surgery

## 2012-05-25 VITALS — BP 184/76 | HR 68 | Temp 98.3°F | Resp 20 | Ht 62.0 in | Wt 122.0 lb

## 2012-05-25 DIAGNOSIS — K429 Umbilical hernia without obstruction or gangrene: Secondary | ICD-10-CM

## 2012-05-25 NOTE — Progress Notes (Signed)
Patient ID: [name], female   DOB: 1924-06-09, 78 y.o.   MRN: [id number]  Chief Complaint  Patient presents with  . Pre-op Exam    eval UMB hernia    HPI [name] is a 78 y.o. female.  This patient is referred by Dr. Tomasa Blase for evaluation of an umbilical hernia. She says that she first noticed this 2 years ago but it was recommended at that time to continue with observation. She says that she does notice a bulge in the area of her umbilicus which is reducible. She says that this does cause discomfort in the area of her umbilicus it is only uncomfortable with certain foods. She says that she has to hold it well she moves her bowels and she takes Dulcolax daily to aid with bowel movements otherwise she is constipated and has to strain to move her bowels. She denies any nausea or vomiting or other obstructive symptoms. Of note, she does have a history of heart failure and coronary artery disease. HPI  Past Medical History  Diagnosis Date  . Anemia   . Arthritis   . Blood transfusion   . Cancer   . Heart murmur   . Hyperlipidemia   . Hypertension   . Osteoporosis   . Thyroid disease     Past Surgical History  Procedure Date  . Appendectomy   . Coronary artery bypass graft     Family History  Problem Relation Age of Onset  . Heart disease Mother   . Heart disease Father     Social History History  Substance Use Topics  . Smoking status: Former Games developer  . Smokeless tobacco: Not on file  . Alcohol Use: No    Allergies  Allergen Reactions  . Amlodipine Besylate   . Aspirin   . Cefdinir   . Cephalexin   . Codeine   . Doxycycline Hyclate   . Erythromycin   . Levofloxacin   . Mometasone   . Peanut-Containing Drug Products   . Progesterone   . Propoxyphene-Acetaminophen   . Valsartan     Current Outpatient Prescriptions  Medication Sig Dispense Refill  . ACTONEL 35 MG tablet       . allopurinol (ZYLOPRIM) 100 MG tablet       . carvedilol (COREG)  6.25 MG tablet       . KLOR-CON M20 20 MEQ tablet       . SYNTHROID 75 MCG tablet       . ZETIA 10 MG tablet         Review of Systems Review of Systems All other review of systems negative or noncontributory except as stated in the HPI  Blood pressure 184/76, pulse 68, temperature 98.3 F (36.8 C), temperature source Temporal, resp. rate 20, height 5\' 2"  (1.575 m), weight 122 lb (55.339 kg).  Physical Exam Physical Exam Physical Exam  Nursing note and vitals reviewed. Constitutional: She is oriented to person, place, and time. She appears well-developed and well-nourished. No distress.  HENT:  Head: Normocephalic and atraumatic.  Mouth/Throat: No oropharyngeal exudate.  Eyes: Conjunctivae and EOM are normal. Pupils are equal, round, and reactive to light. Right eye exhibits no discharge. Left eye exhibits no discharge. No scleral icterus.  Neck: Normal range of motion. Neck supple. No tracheal deviation present.  Cardiovascular: Normal rate, regular rhythm, normal heart sounds and intact distal pulses.   Pulmonary/Chest: Effort normal and breath sounds normal. No stridor. No respiratory distress. She has slight wheezing Abdominal: Soft.  Bowel sounds are normal. She exhibits no distension and no mass. There is mild periumbilical tenderness. There is no rebound and no guarding. She has a small, reducible umbilical hernia which is easily reducible.  She does have some thin skin over the area but no ulceration or rash.  Musculoskeletal: Normal range of motion. She exhibits no edema and no tenderness.  Neurological: She is alert and oriented to person, place, and time.  Skin: Skin is warm and dry. No rash noted. She is not diaphoretic. No erythema. No pallor.  Psychiatric: She has a normal mood and affect. Her behavior is normal. Judgment and thought content normal.    Data Reviewed Outside notes  Assessment    Umbilical hernia -reducible She does have a reducible umbilical hernia  on exam and from history. She has had this for a couple years or ready and other than some discomfort in the area does not bother her. She does not appear to have any obstructive symptoms.  sshe does have some thin skin over the area but there is no evidence of any wound problems or skin ulceration. We had a long discussion about the options for surgery was continued observation and explained that my official surgical recommendation would be for operative repair to prevent skin ulceration or incarceration or strangulation of bowel, however, given her age and comorbidities I am not certain that this is really the best option for her. She would certainly be high risk for surgery, even with an elective outpatient surgery such as this.  Again, I discussed with her the options for surgery versus observation and she is leaning towards observation at this time. I think that this is really probably the best option for her given her age and comorbidities. The risk of incarceration is probably fairly low. The biggest indication to repair this hernia is for symptomatic relief. I recommended that she is take some time to think about what she would like to do and if she would like to undergo repair of this hernia, we will certainly ask her to see her cardiologist preoperatively for optimization. I explained that if she has any increasing pain or obstructive symptoms or if the bulge does not reduce then she should go to the emergency room for evaluation     Plan    She will take sometime to think about her options and call me back if she would like to proceed with surgical repair.       Lodema Pilot DAVID 05/25/2012, 11:34 AM

## 2012-06-01 ENCOUNTER — Encounter (HOSPITAL_BASED_OUTPATIENT_CLINIC_OR_DEPARTMENT_OTHER): Payer: Self-pay | Admitting: Emergency Medicine

## 2012-06-01 ENCOUNTER — Emergency Department (HOSPITAL_BASED_OUTPATIENT_CLINIC_OR_DEPARTMENT_OTHER)
Admission: EM | Admit: 2012-06-01 | Discharge: 2012-06-01 | Disposition: A | Discharge status: Home / Self Care | Payer: Medicare Other | Attending: Emergency Medicine | Admitting: Emergency Medicine

## 2012-06-01 DIAGNOSIS — I10 Essential (primary) hypertension: Secondary | ICD-10-CM | POA: Insufficient documentation

## 2012-06-01 DIAGNOSIS — Z888 Allergy status to other drugs, medicaments and biological substances status: Secondary | ICD-10-CM | POA: Insufficient documentation

## 2012-06-01 DIAGNOSIS — Z87891 Personal history of nicotine dependence: Secondary | ICD-10-CM | POA: Insufficient documentation

## 2012-06-01 DIAGNOSIS — R011 Cardiac murmur, unspecified: Secondary | ICD-10-CM | POA: Insufficient documentation

## 2012-06-01 DIAGNOSIS — W2209XA Striking against other stationary object, initial encounter: Secondary | ICD-10-CM | POA: Insufficient documentation

## 2012-06-01 DIAGNOSIS — S81819A Laceration without foreign body, unspecified lower leg, initial encounter: Secondary | ICD-10-CM

## 2012-06-01 DIAGNOSIS — M129 Arthropathy, unspecified: Secondary | ICD-10-CM | POA: Insufficient documentation

## 2012-06-01 DIAGNOSIS — D649 Anemia, unspecified: Secondary | ICD-10-CM | POA: Insufficient documentation

## 2012-06-01 DIAGNOSIS — Z5189 Encounter for other specified aftercare: Secondary | ICD-10-CM | POA: Insufficient documentation

## 2012-06-01 DIAGNOSIS — Y9210 Unspecified residential institution as the place of occurrence of the external cause: Secondary | ICD-10-CM | POA: Insufficient documentation

## 2012-06-01 DIAGNOSIS — E039 Hypothyroidism, unspecified: Secondary | ICD-10-CM | POA: Insufficient documentation

## 2012-06-01 DIAGNOSIS — Z859 Personal history of malignant neoplasm, unspecified: Secondary | ICD-10-CM | POA: Insufficient documentation

## 2012-06-01 DIAGNOSIS — E785 Hyperlipidemia, unspecified: Secondary | ICD-10-CM | POA: Insufficient documentation

## 2012-06-01 DIAGNOSIS — S81009A Unspecified open wound, unspecified knee, initial encounter: Secondary | ICD-10-CM | POA: Insufficient documentation

## 2012-06-01 DIAGNOSIS — M810 Age-related osteoporosis without current pathological fracture: Secondary | ICD-10-CM | POA: Insufficient documentation

## 2012-06-01 MED ORDER — TETANUS-DIPHTH-ACELL PERTUSSIS 5-2.5-18.5 LF-MCG/0.5 IM SUSP
0.5000 mL | Freq: Once | INTRAMUSCULAR | Status: AC
  Administered 2012-06-01: 0.5 mL via INTRAMUSCULAR
  Filled 2012-06-01: qty 0.5

## 2012-06-01 NOTE — ED Provider Notes (Signed)
History     CSN: [id number]  Arrival date & time 06/01/12  2009   First MD Initiated Contact with Patient 06/01/12 2041      Chief Complaint  Patient presents with  . Laceration    (Consider location/radiation/quality/duration/timing/severity/associated sxs/prior treatment) HPI Comments: 78 y/o female presents with her daughter with left leg laceration s/p walking into a picture frame that was sticking out from under a desk in her home around 7:00 pm tonight. Denies falling. She called her daughter and came directly to ED. Denies lightheadedness or dizziness. Admits to a lot of bleeding due to being on plavix. She applied lots of pressure to wound to control bleeding. Denies numbness or tingling in her leg. States her last tetanus vaccine was many years ago.   Patient is a 78 y.o. female presenting with skin laceration. The history is provided by the patient and a relative.  Laceration     Past Medical History  Diagnosis Date  . Anemia   . Arthritis   . Blood transfusion   . Cancer   . Heart murmur   . Hyperlipidemia   . Hypertension   . Osteoporosis   . Thyroid disease     Past Surgical History  Procedure Date  . Appendectomy   . Coronary artery bypass graft     Family History  Problem Relation Age of Onset  . Heart disease Mother   . Heart disease Father     History  Substance Use Topics  . Smoking status: Former Games developer  . Smokeless tobacco: Not on file  . Alcohol Use: No    OB History    Grav Para Term Preterm Abortions TAB SAB Ect Mult Living                  Review of Systems  Constitutional: Negative for activity change.  Respiratory: Negative for shortness of breath.   Cardiovascular: Negative for chest pain.  Gastrointestinal: Negative for nausea and vomiting.  Musculoskeletal: Negative for arthralgias and gait problem.  Skin: Positive for wound.  Neurological: Negative for dizziness, syncope and light-headedness.  Hematological:  Bruises/bleeds easily.  Psychiatric/Behavioral: Negative for confusion.    Allergies  Amlodipine besylate; Aspirin; Cefdinir; Cephalexin; Codeine; Doxycycline hyclate; Erythromycin; Levofloxacin; Mometasone; Peanut-containing drug products; Progesterone; Propoxyphene-acetaminophen; and Valsartan  Home Medications   Current Outpatient Rx  Name Route Sig Dispense Refill  . ACTONEL 35 MG PO TABS      . ALLOPURINOL 100 MG PO TABS      . CARVEDILOL 6.25 MG PO TABS      . KLOR-CON M20 20 MEQ PO TBCR      . SYNTHROID 75 MCG PO TABS      . ZETIA 10 MG PO TABS        BP 180/56  Pulse 72  Temp 97.8 F (36.6 C) (Oral)  Resp 20  Ht 5\' 2"  (1.575 m)  Wt 119 lb (53.978 kg)  BMI 21.77 kg/m2  SpO2 98%  Physical Exam  Nursing note and vitals reviewed. Constitutional: She is oriented to person, place, and time. She appears well-developed and well-nourished. No distress.  HENT:  Head: Normocephalic and atraumatic.  Eyes: Conjunctivae normal are normal.  Neck: Normal range of motion.  Cardiovascular: Normal rate, regular rhythm and intact distal pulses.   Murmur heard.  Crescendo decrescendo systolic (blowing) murmur is present with a grade of 2/6  Pulses:      Dorsalis pedis pulses are 2+ on the left side.  Posterior tibial pulses are 2+ on the left side.  Pulmonary/Chest: Effort normal and breath sounds normal. No respiratory distress.  Musculoskeletal: Normal range of motion. She exhibits no edema.  Neurological: She is alert and oriented to person, place, and time. No sensory deficit.  Skin: Skin is warm and dry. Laceration (2 in curved laceration present on anterior aspect of left lower leg. no foreign bodies seen or palpated. tender to palpation. bleeding controlled.) noted.       Capillary refill < 3 seconds  Psychiatric: She has a normal mood and affect. Her speech is normal and behavior is normal.    ED Course  Procedures (including critical care time) LACERATION  REPAIR Performed by: Johnnette Gourd Authorized by: Johnnette Gourd Consent: Verbal consent obtained. Risks and benefits: risks, benefits and alternatives were discussed Consent given by: patient Patient identity confirmed: provided demographic data Prepped and Draped in normal sterile fashion Wound explored  Laceration Location: left lower leg  Laceration Length: 2 cm  No Foreign Bodies seen or palpated  Anesthesia: local infiltration  Local anesthetic: lidocaine 2% without epinephrine  Anesthetic total: 5 ml  Irrigation method: syringe Amount of cleaning: standard  Skin closure: 4-0 prolene  Number of sutures: 9  Technique: simple interrupted  Patient tolerance: Patient tolerated the procedure well with no immediate complications.  Labs Reviewed - No data to display No results found.   1. Leg laceration       MDM  78 y/o female with left leg laceration. Wound cleaned with pressure. 9 sutures placed without problem. Discussed wound care in detail with patient and daughter. Patient denying lightheadedness, dizziness, nausea. Ambulating without difficulty. TDAP vaccine given.        Trevor Mace, PA-C 06/01/12 2139

## 2012-06-01 NOTE — ED Notes (Signed)
Walked by a picture frame that was sticking out from under a desk.  Jammed the edge of the frame into her left lower leg

## 2012-06-02 NOTE — ED Provider Notes (Signed)
Medical screening examination/treatment/procedure(s) were performed by non-physician practitioner and as supervising physician I was immediately available for consultation/collaboration.  Ayiana Winslett, MD 06/02/12 1944 

## 2012-06-07 ENCOUNTER — Encounter (INDEPENDENT_AMBULATORY_CARE_PROVIDER_SITE_OTHER): Payer: Self-pay

## 2012-08-24 ENCOUNTER — Encounter: Payer: Self-pay | Admitting: Cardiology

## 2012-09-18 ENCOUNTER — Telehealth: Payer: Self-pay | Admitting: Vascular Surgery

## 2012-09-18 NOTE — Telephone Encounter (Signed)
Made follow-up phone call to the no response chart audit letter.   Mrs. Brotz stated she had too much going on and would call us if she needed an appointment.

## 2013-04-05 ENCOUNTER — Emergency Department (HOSPITAL_COMMUNITY): Payer: Medicare Other

## 2013-04-05 ENCOUNTER — Encounter (HOSPITAL_COMMUNITY): Payer: Self-pay | Admitting: Emergency Medicine

## 2013-04-05 ENCOUNTER — Observation Stay (HOSPITAL_COMMUNITY)
Admission: EM | Admit: 2013-04-05 | Discharge: 2013-04-07 | Disposition: A | Discharge status: Home / Self Care | Payer: Medicare Other | Attending: Internal Medicine | Admitting: Internal Medicine

## 2013-04-05 DIAGNOSIS — R42 Dizziness and giddiness: Secondary | ICD-10-CM

## 2013-04-05 DIAGNOSIS — E860 Dehydration: Secondary | ICD-10-CM

## 2013-04-05 DIAGNOSIS — E871 Hypo-osmolality and hyponatremia: Secondary | ICD-10-CM | POA: Diagnosis present

## 2013-04-05 DIAGNOSIS — Z79899 Other long term (current) drug therapy: Secondary | ICD-10-CM | POA: Insufficient documentation

## 2013-04-05 DIAGNOSIS — N183 Chronic kidney disease, stage 3 unspecified: Secondary | ICD-10-CM | POA: Diagnosis present

## 2013-04-05 DIAGNOSIS — I25810 Atherosclerosis of coronary artery bypass graft(s) without angina pectoris: Secondary | ICD-10-CM

## 2013-04-05 DIAGNOSIS — I10 Essential (primary) hypertension: Secondary | ICD-10-CM | POA: Diagnosis present

## 2013-04-05 DIAGNOSIS — I129 Hypertensive chronic kidney disease with stage 1 through stage 4 chronic kidney disease, or unspecified chronic kidney disease: Secondary | ICD-10-CM | POA: Insufficient documentation

## 2013-04-05 DIAGNOSIS — H8110 Benign paroxysmal vertigo, unspecified ear: Principal | ICD-10-CM | POA: Diagnosis present

## 2013-04-05 DIAGNOSIS — E876 Hypokalemia: Secondary | ICD-10-CM | POA: Insufficient documentation

## 2013-04-05 DIAGNOSIS — N189 Chronic kidney disease, unspecified: Secondary | ICD-10-CM | POA: Insufficient documentation

## 2013-04-05 LAB — CBC WITH DIFFERENTIAL/PLATELET
Eosinophils Relative: 2 % (ref 0–5)
HCT: 34.2 % — ABNORMAL LOW (ref 36.0–46.0)
Lymphocytes Relative: 18 % (ref 12–46)
Lymphs Abs: 2 10*3/uL (ref 0.7–4.0)
MCV: 98.6 fL (ref 78.0–100.0)
Monocytes Absolute: 1 10*3/uL (ref 0.1–1.0)
Monocytes Relative: 9 % (ref 3–12)
RBC: 3.47 MIL/uL — ABNORMAL LOW (ref 3.87–5.11)
WBC: 11.5 10*3/uL — ABNORMAL HIGH (ref 4.0–10.5)

## 2013-04-05 LAB — COMPREHENSIVE METABOLIC PANEL
ALT: 13 U/L (ref 0–35)
CO2: 28 mEq/L (ref 19–32)
Calcium: 8.9 mg/dL (ref 8.4–10.5)
Creatinine, Ser: 1.68 mg/dL — ABNORMAL HIGH (ref 0.50–1.10)
GFR calc Af Amer: 30 mL/min — ABNORMAL LOW (ref >90)
GFR calc non Af Amer: 26 mL/min — ABNORMAL LOW (ref >90)
Glucose, Bld: 229 mg/dL — ABNORMAL HIGH (ref 70–99)

## 2013-04-05 LAB — URINALYSIS, ROUTINE W REFLEX MICROSCOPIC
Bilirubin Urine: NEGATIVE
Nitrite: NEGATIVE
Specific Gravity, Urine: 1.009 (ref 1.005–1.030)
Urobilinogen, UA: 0.2 mg/dL (ref 0.0–1.0)

## 2013-04-05 MED ORDER — MECLIZINE HCL 25 MG PO TABS
25.0000 mg | ORAL_TABLET | Freq: Once | ORAL | Status: AC
  Administered 2013-04-06: 25 mg via ORAL
  Filled 2013-04-05: qty 1

## 2013-04-05 MED ORDER — DIAZEPAM 5 MG/ML IJ SOLN
2.5000 mg | Freq: Once | INTRAMUSCULAR | Status: AC
  Administered 2013-04-06: 2.5 mg via INTRAVENOUS
  Filled 2013-04-05: qty 2

## 2013-04-05 MED ORDER — SODIUM CHLORIDE 0.9 % IV BOLUS (SEPSIS)
500.0000 mL | Freq: Once | INTRAVENOUS | Status: AC
  Administered 2013-04-06: 500 mL via INTRAVENOUS

## 2013-04-05 NOTE — ED Notes (Signed)
PT. IS ALERT AND ORIENTED , SPEECH CLEAR , NO FACIAL ASYMMETRY , EQUAL GRIPS , NO ARM DRIFT . DIZZINES PREVAILS WITH BACK OF HEAD PRESSURE . RESPIRATIONS UNLABORED .  VITAL SIGNS CHECKED - HYPERTENSIVE. NURSE EXPLAINED DELAY , PROCESS AND WAIT TIME .

## 2013-04-05 NOTE — ED Notes (Addendum)
PT. REPORTS DIZZINESS / HEADACHE ONSET Wednesday , DAUGHTER STATED INTERMITTENT CONFUSION THESE PAST DAYS , ALERT AND ORIENTED AT ARRIVAL , ALSO CONCERNED ABOUT BILATERAL LOWER LEGS EDEMA. SPEECH CLEAR / NO FACIAL ASYMMETRY . NO ARM DRIFT / EQUAL GRIPS.

## 2013-04-05 NOTE — ED Provider Notes (Addendum)
CSN: [id number]     Arrival date & time 04/05/13  1914 History     First MD Initiated Contact with Patient 04/05/13 2316     Chief Complaint  Patient presents with  . Dizziness  . Headache  . Altered Mental Status   HPI [name] is a 79 y.o. female presenting with multiple complaints chief of them is dizziness since yesterday. Patient was recently started on Zaroxolyn, and has been dizzy since that.  She describes her dizziness as a sensation of motion, it is associated with nausea, movement makes it worse. When she stays still it gets better or lays down it gets better. He said some mild shortness of breath associated with cough, productive of small amounts of mucus with streaks with light. She thinks this is drainage from her sinuses. She denies any chest pain, fevers, chills. No dysuria or frequency.   Past Medical History  Diagnosis Date  . Anemia   . Arthritis   . Blood transfusion   . Cancer   . Heart murmur   . Hyperlipidemia   . Hypertension   . Osteoporosis   . Thyroid disease    Past Surgical History  Procedure Laterality Date  . Appendectomy    . Coronary artery bypass graft     Family History  Problem Relation Age of Onset  . Heart disease Mother   . Heart disease Father    History  Substance Use Topics  . Smoking status: Former Games developer  . Smokeless tobacco: Not on file  . Alcohol Use: No   OB History   Grav Para Term Preterm Abortions TAB SAB Ect Mult Living                 Review of Systems At least 10pt or greater review of systems completed and are negative except where specified in the HPI.  Allergies  Amlodipine besylate; Aspirin; Cefdinir; Cephalexin; Codeine; Doxycycline hyclate; Erythromycin; Levofloxacin; Mometasone; Peanut-containing drug products; Progesterone; Propoxyphene-acetaminophen; and Valsartan  Home Medications   Current Outpatient Rx  Name  Route  Sig  Dispense  Refill  . acetaminophen (TYLENOL) 500 MG tablet   Oral  Take 500 mg by mouth every 6 (six) hours as needed for pain.         . ACTONEL 35 MG tablet   Oral   Take 35 mg by mouth every Saturday.          Marland Kitchen allopurinol (ZYLOPRIM) 100 MG tablet   Oral   Take 100 mg by mouth daily.          Marland Kitchen atorvastatin (LIPITOR) 40 MG tablet   Oral   Take 40 mg by mouth every evening.         . bisacodyl (DULCOLAX) 10 MG suppository   Rectal   Place 10 mg rectally daily as needed for constipation.         . carvedilol (COREG) 6.25 MG tablet   Oral   Take 6.25 mg by mouth every morning.          . clopidogrel (PLAVIX) 75 MG tablet   Oral   Take 75 mg by mouth daily.         Marland Kitchen conjugated estrogens (PREMARIN) vaginal cream   Vaginal   Place vaginally every Tuesday.         . ezetimibe (ZETIA) 10 MG tablet   Oral   Take 10 mg by mouth daily.         . furosemide (LASIX)  40 MG tablet   Oral   Take 160 mg by mouth 2 (two) times daily.         Marland Kitchen guaiFENesin (MUCINEX) 600 MG 12 hr tablet   Oral   Take 600 mg by mouth daily.         . hydrALAZINE (APRESOLINE) 25 MG tablet   Oral   Take 25 mg by mouth 3 (three) times daily.         . isosorbide mononitrate (IMDUR) 60 MG 24 hr tablet   Oral   Take 60 mg by mouth daily.         Marland Kitchen levothyroxine (SYNTHROID, LEVOTHROID) 75 MCG tablet   Oral   Take 75 mcg by mouth daily.         Marland Kitchen loratadine (CLARITIN) 10 MG tablet   Oral   Take 10 mg by mouth daily.         . metolazone (ZAROXOLYN) 5 MG tablet   Oral   Take 5 mg by mouth daily.         . potassium chloride SA (K-DUR,KLOR-CON) 20 MEQ tablet   Oral   Take 20 mEq by mouth 4 (four) times daily.         . psyllium (REGULOID) 0.52 G capsule   Oral   Take 1.56 g by mouth daily.          BP 211/63  Pulse 60  Temp(Src) 97.9 F (36.6 C) (Oral)  Resp 11  SpO2 95% Physical Exam  Nursing notes reviewed.  Electronic medical record reviewed. VITAL SIGNS:   Filed Vitals:   04/06/13 2100 04/07/13 0500  04/07/13 1110 04/07/13 1453  BP: 171/50 132/50 157/56 158/53  Pulse: 83 68  61  Temp: 98.3 F (36.8 C) 98.2 F (36.8 C)  98.3 F (36.8 C)  TempSrc:    Oral  Resp: 20 18  18   Height:      Weight:      SpO2: 97% 97%  99%   CONSTITUTIONAL: Awake, oriented, appears non-toxic HENT: Atraumatic, normocephalic, oral mucosa pink and tacky, airway patent. Nares patent without drainage. External ears normal. EYES: Conjunctiva clear, EOMI, PERRLA NECK: Trachea midline, non-tender, supple CARDIOVASCULAR: Normal heart rate PULMONARY/CHEST: Clear to auscultation, no rhonchi, wheezes, or rales. Symmetrical breath sounds. Non-tender. ABDOMINAL: Non-distended, soft, non-tender - no rebound or guarding.  BS normal. NEUROLOGIC: Non-focal, moving all four extremities, no gross sensory or motor deficits. EXTREMITIES: No clubbing, cyanosis, or edema SKIN: Warm, Dry, No erythema, No rash  ED Course   Procedures (including critical care time)  Date: 04/06/2013  Rate: 63  Rhythm: normal sinus rhythm  QRS Axis: normal  Intervals: normal  ST/T Wave abnormalities: T-wave abnormalities V1 through the V4  Conduction Disutrbances: Right bundle branch block  Narrative Interpretation: No significant morphological change when compared with 12/25/2009, right bundle branch block, no acute ischemia or infarction  Labs Reviewed  CBC WITH DIFFERENTIAL - Abnormal; Notable for the following:    WBC 11.5 (*)    RBC 3.47 (*)    Hemoglobin 11.7 (*)    HCT 34.2 (*)    Neutro Abs 8.3 (*)    All other components within normal limits  COMPREHENSIVE METABOLIC PANEL - Abnormal; Notable for the following:    Sodium 128 (*)    Potassium 3.0 (*)    Chloride 85 (*)    Glucose, Bld 229 (*)    BUN 55 (*)    Creatinine, Ser 1.68 (*)  Albumin 3.4 (*)    GFR calc non Af Amer 26 (*)    GFR calc Af Amer 30 (*)    All other components within normal limits  URINALYSIS, ROUTINE W REFLEX MICROSCOPIC  CBC  BASIC METABOLIC  PANEL   Ct Head (brain) Wo Contrast  04/05/2013   *RADIOLOGY REPORT*  Clinical Data: Dizziness, headache, altered mental status  CT HEAD WITHOUT CONTRAST  Technique:  Contiguous axial images were obtained from the base of the skull through the vertex without contrast.  Comparison: 05/06/2011  Findings:  stable atrophy and periventricular chronic microvascular ischemic changes.  No acute intracranial hemorrhage, definite infarction, mass lesion, midline shift, herniation, hydrocephalus, or extra-axial fluid collection.  Normal gray-white matter differentiation.  No focal mass effect or edema.  Cisterns are patent.  Cerebellar atrophy as well.  Symmetric orbits.  Mastoids clear.  Frontal, ethmoid and maxillary sinuses clear.  Chronic sinus disease of the sphenoid sinuses with hyperostosis of the walls and mucosal thickening.  IMPRESSION: Stable atrophy and microvascular changes.  No acute intracranial finding  Chronic sphenoid sinusitis   Original Report Authenticated By: Judie Petit. Shick, M.D.   1. Dizzy   2. Hypertension   3. Dehydration   4. Hyponatremia     MDM  Patient to the emergency department with dizziness with clinical suggestion of vertigo. Patient recently started on Zaroxolyn, patient found to be hyponatremic, hypokalemic hemoconcentrated with an elevation of BUN and creatinine suggestive of prerenal azotemia and dehydration. Given the patient fluids, while the patient for dehydration and dizziness. Discussed with Dr. Julian Reil for admission.  Jones Skene, MD 04/06/13 1610  Jones Skene, MD 04/24/13 1326

## 2013-04-06 DIAGNOSIS — N189 Chronic kidney disease, unspecified: Secondary | ICD-10-CM

## 2013-04-06 DIAGNOSIS — I10 Essential (primary) hypertension: Secondary | ICD-10-CM

## 2013-04-06 DIAGNOSIS — E871 Hypo-osmolality and hyponatremia: Secondary | ICD-10-CM | POA: Diagnosis present

## 2013-04-06 DIAGNOSIS — H8110 Benign paroxysmal vertigo, unspecified ear: Secondary | ICD-10-CM

## 2013-04-06 DIAGNOSIS — R42 Dizziness and giddiness: Secondary | ICD-10-CM

## 2013-04-06 LAB — CBC
HCT: 34.1 % — ABNORMAL LOW (ref 36.0–46.0)
Hemoglobin: 11.3 g/dL — ABNORMAL LOW (ref 12.0–15.0)
MCV: 98 fL (ref 78.0–100.0)
RBC: 3.48 MIL/uL — ABNORMAL LOW (ref 3.87–5.11)
WBC: 10.1 10*3/uL (ref 4.0–10.5)

## 2013-04-06 LAB — BASIC METABOLIC PANEL
BUN: 49 mg/dL — ABNORMAL HIGH (ref 6–23)
CO2: 30 mEq/L (ref 19–32)
Chloride: 89 mEq/L — ABNORMAL LOW (ref 96–112)
Creatinine, Ser: 1.33 mg/dL — ABNORMAL HIGH (ref 0.50–1.10)
Potassium: 2.8 mEq/L — ABNORMAL LOW (ref 3.5–5.1)

## 2013-04-06 MED ORDER — ONDANSETRON HCL 4 MG/2ML IJ SOLN: 4.0000 mg | Freq: Three times a day (TID) | INTRAMUSCULAR | Status: AC | PRN

## 2013-04-06 MED ORDER — SODIUM CHLORIDE 0.9 % IJ SOLN: 3.0000 mL | Freq: Two times a day (BID) | INTRAMUSCULAR | Status: DC

## 2013-04-06 MED ORDER — SODIUM CHLORIDE 0.9 % IV SOLN
INTRAVENOUS | Status: AC
  Administered 2013-04-06: 1000 mL via INTRAVENOUS

## 2013-04-06 MED ORDER — SODIUM CHLORIDE 0.9 % IJ SOLN: 3.0000 mL | INTRAMUSCULAR | Status: DC | PRN

## 2013-04-06 MED ORDER — ATORVASTATIN CALCIUM 40 MG PO TABS
40.0000 mg | ORAL_TABLET | Freq: Every evening | ORAL | Status: DC
  Administered 2013-04-06 – 2013-04-07 (×2): 40 mg via ORAL
  Filled 2013-04-06 (×2): qty 1

## 2013-04-06 MED ORDER — CLOPIDOGREL BISULFATE 75 MG PO TABS
75.0000 mg | ORAL_TABLET | Freq: Every day | ORAL | Status: DC
  Administered 2013-04-06 – 2013-04-07 (×2): 75 mg via ORAL
  Filled 2013-04-06 (×2): qty 1

## 2013-04-06 MED ORDER — MECLIZINE HCL 25 MG PO TABS: 25.0000 mg | ORAL_TABLET | Freq: Three times a day (TID) | ORAL | Status: DC | PRN

## 2013-04-06 MED ORDER — BISACODYL 5 MG PO TBEC
5.0000 mg | DELAYED_RELEASE_TABLET | Freq: Every day | ORAL | Status: DC | PRN
  Administered 2013-04-06: 5 mg via ORAL
  Filled 2013-04-06 (×2): qty 1

## 2013-04-06 MED ORDER — POTASSIUM CHLORIDE CRYS ER 20 MEQ PO TBCR
40.0000 meq | EXTENDED_RELEASE_TABLET | Freq: Once | ORAL | Status: AC
  Administered 2013-04-06: 40 meq via ORAL
  Filled 2013-04-06: qty 2

## 2013-04-06 MED ORDER — SODIUM CHLORIDE 0.9 % IV SOLN: INTRAVENOUS | Status: DC

## 2013-04-06 MED ORDER — SODIUM CHLORIDE 0.9 % IJ SOLN
3.0000 mL | Freq: Two times a day (BID) | INTRAMUSCULAR | Status: DC
  Administered 2013-04-06 – 2013-04-07 (×2): 3 mL via INTRAVENOUS

## 2013-04-06 MED ORDER — GUAIFENESIN ER 600 MG PO TB12
600.0000 mg | ORAL_TABLET | Freq: Every day | ORAL | Status: DC
  Administered 2013-04-06 – 2013-04-07 (×2): 600 mg via ORAL
  Filled 2013-04-06 (×2): qty 1

## 2013-04-06 MED ORDER — ISOSORBIDE MONONITRATE ER 60 MG PO TB24
60.0000 mg | ORAL_TABLET | Freq: Every day | ORAL | Status: DC
  Administered 2013-04-06 – 2013-04-07 (×2): 60 mg via ORAL
  Filled 2013-04-06 (×2): qty 1

## 2013-04-06 MED ORDER — HYDRALAZINE HCL 20 MG/ML IJ SOLN: 10.0000 mg | INTRAMUSCULAR | Status: DC | PRN

## 2013-04-06 MED ORDER — BISACODYL 10 MG RE SUPP: 10.0000 mg | Freq: Every day | RECTAL | Status: DC | PRN

## 2013-04-06 MED ORDER — EZETIMIBE 10 MG PO TABS
10.0000 mg | ORAL_TABLET | Freq: Every day | ORAL | Status: DC
  Administered 2013-04-06 – 2013-04-07 (×2): 10 mg via ORAL
  Filled 2013-04-06 (×2): qty 1

## 2013-04-06 MED ORDER — HEPARIN SODIUM (PORCINE) 5000 UNIT/ML IJ SOLN
5000.0000 [IU] | Freq: Three times a day (TID) | INTRAMUSCULAR | Status: DC
  Administered 2013-04-06 – 2013-04-07 (×4): 5000 [IU] via SUBCUTANEOUS
  Filled 2013-04-06 (×6): qty 1

## 2013-04-06 MED ORDER — MECLIZINE HCL 25 MG PO TABS
25.0000 mg | ORAL_TABLET | Freq: Three times a day (TID) | ORAL | Status: DC | PRN
  Filled 2013-04-06: qty 1

## 2013-04-06 MED ORDER — HYDRALAZINE HCL 25 MG PO TABS
25.0000 mg | ORAL_TABLET | Freq: Three times a day (TID) | ORAL | Status: DC
  Administered 2013-04-06 – 2013-04-07 (×5): 25 mg via ORAL
  Filled 2013-04-06 (×6): qty 1

## 2013-04-06 MED ORDER — CARVEDILOL 6.25 MG PO TABS
6.2500 mg | ORAL_TABLET | Freq: Every morning | ORAL | Status: DC
  Administered 2013-04-06 – 2013-04-07 (×2): 6.25 mg via ORAL
  Filled 2013-04-06 (×2): qty 1

## 2013-04-06 MED ORDER — POTASSIUM CHLORIDE CRYS ER 20 MEQ PO TBCR: 20.0000 meq | EXTENDED_RELEASE_TABLET | Freq: Four times a day (QID) | ORAL | Status: DC

## 2013-04-06 MED ORDER — LEVOTHYROXINE SODIUM 75 MCG PO TABS
75.0000 ug | ORAL_TABLET | Freq: Every day | ORAL | Status: DC
  Administered 2013-04-06 – 2013-04-07 (×2): 75 ug via ORAL
  Filled 2013-04-06 (×3): qty 1

## 2013-04-06 MED ORDER — PSYLLIUM 95 % PO PACK
1.0000 | PACK | Freq: Every day | ORAL | Status: DC
  Administered 2013-04-06: 1 via ORAL
  Filled 2013-04-06 (×2): qty 1

## 2013-04-06 MED ORDER — POTASSIUM CHLORIDE CRYS ER 20 MEQ PO TBCR
40.0000 meq | EXTENDED_RELEASE_TABLET | Freq: Every day | ORAL | Status: DC
  Administered 2013-04-07: 40 meq via ORAL
  Filled 2013-04-06: qty 2

## 2013-04-06 MED ORDER — LACTATED RINGERS IV SOLN: INTRAVENOUS | Status: DC

## 2013-04-06 MED ORDER — LORATADINE 10 MG PO TABS
10.0000 mg | ORAL_TABLET | Freq: Every day | ORAL | Status: DC
  Administered 2013-04-06 – 2013-04-07 (×2): 10 mg via ORAL
  Filled 2013-04-06 (×2): qty 1

## 2013-04-06 MED ORDER — HYDRALAZINE HCL 20 MG/ML IJ SOLN
5.0000 mg | Freq: Once | INTRAMUSCULAR | Status: AC
  Administered 2013-04-06: 5 mg via INTRAVENOUS
  Filled 2013-04-06: qty 1

## 2013-04-06 MED ORDER — SODIUM CHLORIDE 0.9 % IV SOLN: 250.0000 mL | INTRAVENOUS | Status: DC | PRN

## 2013-04-06 MED ORDER — POTASSIUM CHLORIDE 20 MEQ/15ML (10%) PO LIQD
40.0000 meq | Freq: Every day | ORAL | Status: DC
  Administered 2013-04-06: 40 meq via ORAL
  Filled 2013-04-06: qty 30

## 2013-04-06 MED ORDER — PSYLLIUM 0.52 G PO CAPS: 1.5600 g | ORAL_CAPSULE | Freq: Every day | ORAL | Status: DC

## 2013-04-06 MED ORDER — DIAZEPAM 5 MG/ML IJ SOLN: 2.5000 mg | INTRAMUSCULAR | Status: DC | PRN

## 2013-04-06 MED ORDER — ALLOPURINOL 100 MG PO TABS
100.0000 mg | ORAL_TABLET | Freq: Every day | ORAL | Status: DC
  Administered 2013-04-06 – 2013-04-07 (×2): 100 mg via ORAL
  Filled 2013-04-06 (×2): qty 1

## 2013-04-06 MED ORDER — RISEDRONATE SODIUM 5 MG PO TABS: 35.0000 mg | ORAL_TABLET | ORAL | Status: DC

## 2013-04-06 NOTE — Progress Notes (Signed)
Utilization review completed.  

## 2013-04-06 NOTE — Progress Notes (Signed)
Nutrition Brief Note  Patient identified on the Malnutrition Screening Tool (MST) Report. On admission, pt reported that she was unsure if she had weight loss. Per chart review, weights have been very stable x 1 year. Denies poor appetite.  Wt Readings from Last 15 Encounters:  04/06/13 121 lb 14.4 oz (55.293 kg)  06/01/12 119 lb (53.978 kg)  05/25/12 122 lb (55.339 kg)  12/25/09 137 lb 4 oz (62.256 kg)  04/22/09 143 lb (64.864 kg)    Body mass index is 22.29 kg/(m^2). Patient meets criteria for normal weight based on current BMI.   Current diet order is Heart Healthy. Labs and medications reviewed.   No nutrition interventions warranted at this time. If nutrition issues arise, please consult RD.   Kristina Motto MS, RD, LDN Pager: [phone] After-hours pager: [phone]

## 2013-04-06 NOTE — Discharge Summary (Signed)
Physician Discharge Summary  Shawnia Vizcarrondo WGN:[id number] DOB: 17-Jun-1924 DOA: 04/05/2013  PCP: Paulina Fusi, MD  Admit date: 04/05/2013 Discharge date: 04/07/2013  Time spent: 30 minutes  Recommendations for Outpatient Follow-up:  1. Please review hypertensive meds and titrate accordingly 2. Would confirm "allergy" to ARB and Ca channel blockers as we are becoming limited with options for bp treatment 3. Pt declined home PT for vertigo, but would consider as outpatient if still warranted  Discharge Diagnoses:  Principal Problem:   BPV (benign positional vertigo) Active Problems:   HYPERTENSION, UNSPECIFIED   Hyponatremia   CKD (chronic kidney disease)   Discharge Condition: Stable  Diet recommendation: Heart Healthy  Filed Weights   04/06/13 0133  Weight: 55.293 kg (121 lb 14.4 oz)    History of present illness:  [name] is a 79 y.o. female who presents with dizziness and headache onset yesterday. Of note she just had a medication change at Dr. Edd Arbour office on Monday and had taken 1 dose of the new zaroxolyn prescription the evening before (states that unlike her Lasix, the zaroxylen did cause her to put out a large amount of urine). Unfortunately her symptoms started the next morning (yesterday), and were persistent so she presented to the ED.  In the ED her symptoms were mildly improved with antivert and valium, has nystagmus and worse dizziness based on position. But her lab work was also abnormal including hyponatremia, hypochloremia, and hypokalemia. She was given 500 cc IVF bolus in the ED, and Hospitalist has been asked to admit the patient for observation.  Hospital Course:  The patient was admitted to the floor. Given concerns of dehydration, the patient's diuretics were held and the patient was continued on gentle hydration in light of her heart failure hx. The patient's renal function improved with fluids. The patient continued to have intermittent  classic vertiginous symptoms. The patient was continued on meclizine with vestibular PT ordered. PT had recommended home PT, however, in the end, the patient declined home PT. Would defer to PCP  Discharge Exam: Filed Vitals:   04/06/13 2100 04/07/13 0500 04/07/13 1110 04/07/13 1453  BP: 171/50 132/50 157/56 158/53  Pulse: 83 68  61  Temp: 98.3 F (36.8 C) 98.2 F (36.8 C)  98.3 F (36.8 C)  TempSrc:    Oral  Resp: 20 18  18   Height:      Weight:      SpO2: 97% 97%  99%    General: Awake, in nad Cardiovascular: regular, s1, s2 Respiratory: normal resp effort, no wheezing  Discharge Instructions     Medication List    STOP taking these medications       metolazone 5 MG tablet  Commonly known as:  ZAROXOLYN      TAKE these medications       acetaminophen 500 MG tablet  Commonly known as:  TYLENOL  Take 500 mg by mouth every 6 (six) hours as needed for pain.     ACTONEL 35 MG tablet  Generic drug:  risedronate  Take 35 mg by mouth every Saturday.     allopurinol 100 MG tablet  Commonly known as:  ZYLOPRIM  Take 100 mg by mouth daily.     atorvastatin 40 MG tablet  Commonly known as:  LIPITOR  Take 40 mg by mouth every evening.     bisacodyl 10 MG suppository  Commonly known as:  DULCOLAX  Place 10 mg rectally daily as needed for constipation.     carvedilol  6.25 MG tablet  Commonly known as:  COREG  Take 6.25 mg by mouth every morning.     clopidogrel 75 MG tablet  Commonly known as:  PLAVIX  Take 75 mg by mouth daily.     conjugated estrogens vaginal cream  Commonly known as:  PREMARIN  Place vaginally every Tuesday.     ezetimibe 10 MG tablet  Commonly known as:  ZETIA  Take 10 mg by mouth daily.     furosemide 40 MG tablet  Commonly known as:  LASIX  Take 1 tablet (40 mg total) by mouth 2 (two) times daily.     guaiFENesin 600 MG 12 hr tablet  Commonly known as:  MUCINEX  Take 600 mg by mouth daily.     hydrALAZINE 25 MG tablet   Commonly known as:  APRESOLINE  Take 25 mg by mouth 3 (three) times daily.     isosorbide mononitrate 60 MG 24 hr tablet  Commonly known as:  IMDUR  Take 60 mg by mouth daily.     levothyroxine 75 MCG tablet  Commonly known as:  SYNTHROID, LEVOTHROID  Take 75 mcg by mouth daily.     loratadine 10 MG tablet  Commonly known as:  CLARITIN  Take 10 mg by mouth daily.     meclizine 25 MG tablet  Commonly known as:  ANTIVERT  Take 1 tablet (25 mg total) by mouth 3 (three) times daily as needed for dizziness or nausea.     potassium chloride SA 20 MEQ tablet  Commonly known as:  K-DUR,KLOR-CON  Take 20 mEq by mouth 4 (four) times daily.     psyllium 0.52 G capsule  Commonly known as:  REGULOID  Take 1.56 g by mouth daily.       Allergies  Allergen Reactions  . Contrast Media (Iodinated Diagnostic Agents) Itching  . Amlodipine Besylate   . Aspirin Swelling  . Cefdinir Nausea And Vomiting  . Cephalexin Nausea And Vomiting  . Codeine Nausea And Vomiting and Other (See Comments)    dizziness  . Doxycycline Hyclate Nausea And Vomiting  . Erythromycin Nausea And Vomiting  . Levofloxacin Nausea And Vomiting  . Mometasone   . Peanut-Containing Drug Products Nausea And Vomiting  . Progesterone   . Propoxyphene-Acetaminophen   . Valsartan    Follow-up Information   Follow up with SCHULTZ,DOUGLAS E, MD. Schedule an appointment as soon as possible for a visit in 1 week.   Contact information:   [address] [place] [address] [phone]       Follow up with MATTINGLY,MICHAEL T, MD. Schedule an appointment as soon as possible for a visit in 2 weeks.   Contact information:   [address] [place] [address] [phone]        The results of significant diagnostics from this hospitalization (including imaging, microbiology, ancillary and laboratory) are listed below for reference.    Significant Diagnostic Studies: Ct Head (brain) Wo Contrast  04/05/2013    *RADIOLOGY REPORT*  Clinical Data: Dizziness, headache, altered mental status  CT HEAD WITHOUT CONTRAST  Technique:  Contiguous axial images were obtained from the base of the skull through the vertex without contrast.  Comparison: 05/06/2011  Findings:  stable atrophy and periventricular chronic microvascular ischemic changes.  No acute intracranial hemorrhage, definite infarction, mass lesion, midline shift, herniation, hydrocephalus, or extra-axial fluid collection.  Normal gray-white matter differentiation.  No focal mass effect or edema.  Cisterns are patent.  Cerebellar atrophy  as well.  Symmetric orbits.  Mastoids clear.  Frontal, ethmoid and maxillary sinuses clear.  Chronic sinus disease of the sphenoid sinuses with hyperostosis of the walls and mucosal thickening.  IMPRESSION: Stable atrophy and microvascular changes.  No acute intracranial finding  Chronic sphenoid sinusitis   Original Report Authenticated By: Judie Petit. Miles Costain, M.D.   Dg Chest Port 1 View  04/05/2013   *RADIOLOGY REPORT*  Clinical Data: Altered mental status, dehydration, heart murmur, hypertension  PORTABLE CHEST - 1 VIEW  Comparison: 05/06/2011  Findings: Prior coronary bypass changes noted.  Cardiomegaly evident with chronic vascular congestion and basilar interstitial changes, suspect fibrosis.  No definite superimposed CHF for pneumonia.  No effusion or pneumothorax.  Atherosclerosis of the aorta and subclavian arteries.  Chronic biapical pleural parenchymal thickening.  No significant interval change.  IMPRESSION: Cardiomegaly with chronic vascular congestion and basilar interstitial changes versus fibrosis.  No new finding.   Original Report Authenticated By: Judie Petit. Miles Costain, M.D.    Microbiology: No results found for this or any previous visit (from the past 240 hour(s)).   Labs: Basic Metabolic Panel:  Recent Labs Lab 04/05/13 1936 04/06/13 0500 04/07/13 0444 04/07/13 0800  NA 128* 133* 132* 133*  K 3.0* 2.8* 2.6* 3.2*  CL  85* 89* 93* 93*  CO2 28 30 28 29   GLUCOSE 229* 117* 109* 135*  BUN 55* 49* 53* 49*  CREATININE 1.68* 1.33* 1.31* 1.23*  CALCIUM 8.9 9.1 8.4 8.8  MG  --   --   --  2.1   Liver Function Tests:  Recent Labs Lab 04/05/13 1936 04/07/13 0444  AST 24 22  ALT 13 10  ALKPHOS 72 58  BILITOT 0.6 0.6  PROT 7.1 5.7*  ALBUMIN 3.4* 2.8*   No results found for this basename: LIPASE, AMYLASE,  in the last 168 hours No results found for this basename: AMMONIA,  in the last 168 hours CBC:  Recent Labs Lab 04/05/13 1936 04/06/13 0500  WBC 11.5* 10.1  NEUTROABS 8.3*  --   HGB 11.7* 11.3*  HCT 34.2* 34.1*  MCV 98.6 98.0  PLT 216 212   Cardiac Enzymes: No results found for this basename: CKTOTAL, CKMB, CKMBINDEX, TROPONINI,  in the last 168 hours BNP: BNP (last 3 results) No results found for this basename: PROBNP,  in the last 8760 hours CBG: No results found for this basename: GLUCAP,  in the last 168 hours     Signed:  CHIU, STEPHEN K  [facility] 04/07/2013, 4:12 PM

## 2013-04-06 NOTE — ED Notes (Signed)
Patient reports that she is allergic to the hospital linen. Patient has brought linen from home. Informed 3W of this. Patient being transported now. Requested to strip bed linen as we will use patient's home linen.

## 2013-04-06 NOTE — H&P (Signed)
[facility] History and Physical  [name] RUE:[id number] DOB: 1924/08/12 DOA: 04/05/2013  Referring physician: ED PCP: Paulina Fusi, MD   Chief Complaint: dizziness, headache  HPI: [name] is a 79 y.o. female who presents with dizziness and headache onset yesterday.  Of note she just had a medication change at Dr. Edd Arbour office on Monday and had taken 1 dose of the new zaroxolyn prescription the evening before (states that unlike her Lasix, the zaroxylen did cause her to put out a large amount of urine).  Unfortunately her symptoms started the next morning (yesterday), and were persistent so she presented to the ED.  In the ED her symptoms were mildly improved with antivert and valium, has nystagmus and worse dizziness based on position.  But her lab work was also abnormal including hyponatremia, hypochloremia, and hypokalemia.  She was given 500 cc IVF bolus in the ED, and  Hospitalist has been asked to admit the patient for observation.  Review of Systems: 12 systems reviewed and otherwise negative.  Past Medical History  Diagnosis Date  . Anemia   . Arthritis   . Blood transfusion   . Cancer   . Heart murmur   . Hyperlipidemia   . Hypertension   . Osteoporosis   . Thyroid disease    Past Surgical History  Procedure Laterality Date  . Appendectomy    . Coronary artery bypass graft     Social History:  reports that she has quit smoking. She does not have any smokeless tobacco history on file. She reports that she does not drink alcohol or use illicit drugs.   Allergies  Allergen Reactions  . Contrast Media (Iodinated Diagnostic Agents) Itching  . Amlodipine Besylate   . Aspirin Swelling  . Cefdinir Nausea And Vomiting  . Cephalexin Nausea And Vomiting  . Codeine Nausea And Vomiting and Other (See Comments)    dizziness  . Doxycycline Hyclate Nausea And Vomiting  . Erythromycin Nausea And Vomiting  . Levofloxacin Nausea And Vomiting  .  Mometasone   . Peanut-Containing Drug Products Nausea And Vomiting  . Progesterone   . Propoxyphene-Acetaminophen   . Valsartan     Family History  Problem Relation Age of Onset  . Heart disease Mother   . Heart disease Father     Prior to Admission medications   Medication Sig Start Date End Date Taking? Authorizing Provider  acetaminophen (TYLENOL) 500 MG tablet Take 500 mg by mouth every 6 (six) hours as needed for pain.   Yes Historical Provider, MD  ACTONEL 35 MG tablet Take 35 mg by mouth every Saturday.  04/12/12  Yes Historical Provider, MD  allopurinol (ZYLOPRIM) 100 MG tablet Take 100 mg by mouth daily.  05/02/12  Yes Historical Provider, MD  atorvastatin (LIPITOR) 40 MG tablet Take 40 mg by mouth every evening.   Yes Historical Provider, MD  bisacodyl (DULCOLAX) 10 MG suppository Place 10 mg rectally daily as needed for constipation.   Yes Historical Provider, MD  carvedilol (COREG) 6.25 MG tablet Take 6.25 mg by mouth every morning.  03/29/12  Yes Historical Provider, MD  clopidogrel (PLAVIX) 75 MG tablet Take 75 mg by mouth daily.   Yes Historical Provider, MD  conjugated estrogens (PREMARIN) vaginal cream Place vaginally every Tuesday.   Yes Historical Provider, MD  ezetimibe (ZETIA) 10 MG tablet Take 10 mg by mouth daily.   Yes Historical Provider, MD  furosemide (LASIX) 40 MG tablet Take 160 mg by mouth 2 (two) times daily.  Yes Historical Provider, MD  guaiFENesin (MUCINEX) 600 MG 12 hr tablet Take 600 mg by mouth daily.   Yes Historical Provider, MD  hydrALAZINE (APRESOLINE) 25 MG tablet Take 25 mg by mouth 3 (three) times daily.   Yes Historical Provider, MD  isosorbide mononitrate (IMDUR) 60 MG 24 hr tablet Take 60 mg by mouth daily.   Yes Historical Provider, MD  levothyroxine (SYNTHROID, LEVOTHROID) 75 MCG tablet Take 75 mcg by mouth daily.   Yes Historical Provider, MD  loratadine (CLARITIN) 10 MG tablet Take 10 mg by mouth daily.   Yes Historical Provider, MD   metolazone (ZAROXOLYN) 5 MG tablet Take 5 mg by mouth daily.   Yes Historical Provider, MD  potassium chloride SA (K-DUR,KLOR-CON) 20 MEQ tablet Take 20 mEq by mouth 4 (four) times daily.   Yes Historical Provider, MD  psyllium (REGULOID) 0.52 G capsule Take 1.56 g by mouth daily.   Yes Historical Provider, MD   Physical Exam: Filed Vitals:   04/06/13 0133  BP: 185/57  Pulse: 71  Temp: 97.9 F (36.6 C)  Resp: 16    General:  NAD, resting comfortably in bed Eyes: PEERLA EOMI ENT: mucous membranes moist Neck: supple w/o JVD Cardiovascular: RRR w/o MRG Respiratory: CTA B Abdomen: soft, nt, nd, bs+ Skin: no rash nor lesion, 2+ pitting edema BLE Musculoskeletal: MAE, full ROM all 4 extremities Psychiatric: normal tone and affect Neurologic: AAOx3, grossly non-focal  Labs on Admission:  Basic Metabolic Panel:  Recent Labs Lab 04/05/13 1936  NA 128*  K 3.0*  CL 85*  CO2 28  GLUCOSE 229*  BUN 55*  CREATININE 1.68*  CALCIUM 8.9   Liver Function Tests:  Recent Labs Lab 04/05/13 1936  AST 24  ALT 13  ALKPHOS 72  BILITOT 0.6  PROT 7.1  ALBUMIN 3.4*   No results found for this basename: LIPASE, AMYLASE,  in the last 168 hours No results found for this basename: AMMONIA,  in the last 168 hours CBC:  Recent Labs Lab 04/05/13 1936  WBC 11.5*  NEUTROABS 8.3*  HGB 11.7*  HCT 34.2*  MCV 98.6  PLT 216   Cardiac Enzymes: No results found for this basename: CKTOTAL, CKMB, CKMBINDEX, TROPONINI,  in the last 168 hours  BNP (last 3 results) No results found for this basename: PROBNP,  in the last 8760 hours CBG: No results found for this basename: GLUCAP,  in the last 168 hours  Radiological Exams on Admission: Ct Head (brain) Wo Contrast  04/05/2013   *RADIOLOGY REPORT*  Clinical Data: Dizziness, headache, altered mental status  CT HEAD WITHOUT CONTRAST  Technique:  Contiguous axial images were obtained from the base of the skull through the vertex without  contrast.  Comparison: 05/06/2011  Findings:  stable atrophy and periventricular chronic microvascular ischemic changes.  No acute intracranial hemorrhage, definite infarction, mass lesion, midline shift, herniation, hydrocephalus, or extra-axial fluid collection.  Normal gray-white matter differentiation.  No focal mass effect or edema.  Cisterns are patent.  Cerebellar atrophy as well.  Symmetric orbits.  Mastoids clear.  Frontal, ethmoid and maxillary sinuses clear.  Chronic sinus disease of the sphenoid sinuses with hyperostosis of the walls and mucosal thickening.  IMPRESSION: Stable atrophy and microvascular changes.  No acute intracranial finding  Chronic sphenoid sinusitis   Original Report Authenticated By: Judie Petit. Miles Costain, M.D.   Dg Chest Port 1 View  04/05/2013   *RADIOLOGY REPORT*  Clinical Data: Altered mental status, dehydration, heart murmur, hypertension  PORTABLE CHEST -  1 VIEW  Comparison: 05/06/2011  Findings: Prior coronary bypass changes noted.  Cardiomegaly evident with chronic vascular congestion and basilar interstitial changes, suspect fibrosis.  No definite superimposed CHF for pneumonia.  No effusion or pneumothorax.  Atherosclerosis of the aorta and subclavian arteries.  Chronic biapical pleural parenchymal thickening.  No significant interval change.  IMPRESSION: Cardiomegaly with chronic vascular congestion and basilar interstitial changes versus fibrosis.  No new finding.   Original Report Authenticated By: Judie Petit. Miles Costain, M.D.    EKG: Independently reviewed.  Assessment/Plan Principal Problem:   BPV (benign positional vertigo) Active Problems:   HYPERTENSION, UNSPECIFIED   Hyponatremia   CKD (chronic kidney disease)   1. BPV - continue valium and antivert 2. Hyponatremia - likely secondary to zaroxolyn this certianly could be contributing to headache and dizziness, so holding zaroxolyn at this time, patient is showing some signs of pulmonary vascular congestion so wont treat with  IVF at this time. 3. CKD - creatinine 1.68, unclear baseline, could be an acute component so holding lasix and zaroxolyn, with SBPs as high as 211 in the ED I do not think that the patient is pre-renal.  May wish to restart lasix tomorrow based on what her baseline ends up being or UOP ends up being. 4. Hypertension - SBPs as high as 211 in ED, controlling with PRN hydralazine in addition to continuing her home BP meds, not clear that this represents the cause of her headache and dizziness or not (hypertensive urgency) but BP goals are < 180 at this point and we will of course treat this very high blood pressure. 5. Hypokalemia - giving PO potassium but holding home scheduled doses as we are holding her diuretics for the moment.    Code Status: Full Code (must indicate code status--if unknown or must be presumed, indicate so) Family Communication: Spoke with daughter at bedside (indicate person spoken with, if applicable, with phone number if by telephone) Disposition Plan: Admit to obs (indicate anticipated LOS)  Time spent: 70 min  Saleema Weppler M. [facility] Pager [phone]  If 7PM-7AM, please contact night-coverage [email or web address] Password [facility] 04/06/2013, 2:34 AM

## 2013-04-06 NOTE — Progress Notes (Signed)
[facility] PROGRESS NOTE  [name] WUJ:[id number] DOB: 1923/10/18 DOA: 04/05/2013 PCP: Paulina Fusi, MD  Assessment/Plan: Dizziness: - Suspect dehydration secondary to recently increased diuretic use - Diuretics held thus far  CKD: - Followed by Dr. Melony Overly - Will follow renal function closely - Discussed case with on-call nephrologist who agrees with continuing to hold diuretics and to gently hydrate as tolerated  HTN: - Stable presently. - Cont current regimen and titrate as needed  Hypkoalemia: - Will replace  Hyponatremia: - Suspected due to dehydration  Code Status: Full Family Communication: Pt in room (indicate person spoken with, relationship, and if by phone, the number) Disposition Plan: Pending   HPI/Subjective: No major events overnight  Objective: Filed Vitals:   04/06/13 0913 04/06/13 1002 04/06/13 1043 04/06/13 1222  BP: 154/42 132/66 132/66 133/52  Pulse: 75  54 80  Temp: 97.6 F (36.4 C)  98.2 F (36.8 C) 98 F (36.7 C)  TempSrc: Oral  Oral Oral  Resp: 19  18 18   Height:      Weight:      SpO2: 95%  94% 95%    Intake/Output Summary (Last 24 hours) at 04/06/13 1432 Last data filed at 04/06/13 1223  Gross per 24 hour  Intake      3 ml  Output    300 ml  Net   -297 ml   Filed Weights   04/06/13 0133  Weight: 55.293 kg (121 lb 14.4 oz)    Exam:   General:  Awake, in nad  Cardiovascular: regular, s1, s2  Respiratory: normal resp effort, no wheezing  Abdomen: soft, nondistended  Musculoskeletal: perfused,no clubbing   Data Reviewed: Basic Metabolic Panel:  Recent Labs Lab 04/05/13 1936 04/06/13 0500  NA 128* 133*  K 3.0* 2.8*  CL 85* 89*  CO2 28 30  GLUCOSE 229* 117*  BUN 55* 49*  CREATININE 1.68* 1.33*  CALCIUM 8.9 9.1   Liver Function Tests:  Recent Labs Lab 04/05/13 1936  AST 24  ALT 13  ALKPHOS 72  BILITOT 0.6  PROT 7.1  ALBUMIN 3.4*   No results found for this basename: LIPASE,  AMYLASE,  in the last 168 hours No results found for this basename: AMMONIA,  in the last 168 hours CBC:  Recent Labs Lab 04/05/13 1936 04/06/13 0500  WBC 11.5* 10.1  NEUTROABS 8.3*  --   HGB 11.7* 11.3*  HCT 34.2* 34.1*  MCV 98.6 98.0  PLT 216 212   Cardiac Enzymes: No results found for this basename: CKTOTAL, CKMB, CKMBINDEX, TROPONINI,  in the last 168 hours BNP (last 3 results) No results found for this basename: PROBNP,  in the last 8760 hours CBG: No results found for this basename: GLUCAP,  in the last 168 hours  No results found for this or any previous visit (from the past 240 hour(s)).   Studies: Ct Head (brain) Wo Contrast  04/05/2013   *RADIOLOGY REPORT*  Clinical Data: Dizziness, headache, altered mental status  CT HEAD WITHOUT CONTRAST  Technique:  Contiguous axial images were obtained from the base of the skull through the vertex without contrast.  Comparison: 05/06/2011  Findings:  stable atrophy and periventricular chronic microvascular ischemic changes.  No acute intracranial hemorrhage, definite infarction, mass lesion, midline shift, herniation, hydrocephalus, or extra-axial fluid collection.  Normal gray-white matter differentiation.  No focal mass effect or edema.  Cisterns are patent.  Cerebellar atrophy as well.  Symmetric orbits.  Mastoids clear.  Frontal, ethmoid and maxillary sinuses clear.  Chronic sinus disease of the sphenoid sinuses with hyperostosis of the walls and mucosal thickening.  IMPRESSION: Stable atrophy and microvascular changes.  No acute intracranial finding  Chronic sphenoid sinusitis   Original Report Authenticated By: Judie Petit. Miles Costain, M.D.   Dg Chest Port 1 View  04/05/2013   *RADIOLOGY REPORT*  Clinical Data: Altered mental status, dehydration, heart murmur, hypertension  PORTABLE CHEST - 1 VIEW  Comparison: 05/06/2011  Findings: Prior coronary bypass changes noted.  Cardiomegaly evident with chronic vascular congestion and basilar interstitial  changes, suspect fibrosis.  No definite superimposed CHF for pneumonia.  No effusion or pneumothorax.  Atherosclerosis of the aorta and subclavian arteries.  Chronic biapical pleural parenchymal thickening.  No significant interval change.  IMPRESSION: Cardiomegaly with chronic vascular congestion and basilar interstitial changes versus fibrosis.  No new finding.   Original Report Authenticated By: Judie Petit. Shick, M.D.    Scheduled Meds: . allopurinol  100 mg Oral Daily  . atorvastatin  40 mg Oral QPM  . carvedilol  6.25 mg Oral q morning - 10a  . clopidogrel  75 mg Oral Daily  . ezetimibe  10 mg Oral Daily  . guaiFENesin  600 mg Oral Daily  . heparin  5,000 Units Subcutaneous Q8H  . hydrALAZINE  25 mg Oral TID  . isosorbide mononitrate  60 mg Oral Daily  . levothyroxine  75 mcg Oral QAC breakfast  . loratadine  10 mg Oral Daily  . potassium chloride  40 mEq Oral Daily  . psyllium  1 packet Oral Daily  . sodium chloride  3 mL Intravenous Q12H  . sodium chloride  3 mL Intravenous Q12H   Continuous Infusions:   Principal Problem:   BPV (benign positional vertigo) Active Problems:   HYPERTENSION, UNSPECIFIED   Hyponatremia   CKD (chronic kidney disease)    Time spent:    [name] K  [facility] Pager [phone]. If 7PM-7AM, please contact night-coverage at [email or web address], password [facility] 04/06/2013, 2:32 PM  LOS: 1 day

## 2013-04-07 DIAGNOSIS — E871 Hypo-osmolality and hyponatremia: Secondary | ICD-10-CM

## 2013-04-07 DIAGNOSIS — I25810 Atherosclerosis of coronary artery bypass graft(s) without angina pectoris: Secondary | ICD-10-CM

## 2013-04-07 LAB — COMPREHENSIVE METABOLIC PANEL
ALT: 10 U/L (ref 0–35)
CO2: 28 mEq/L (ref 19–32)
Calcium: 8.4 mg/dL (ref 8.4–10.5)
Chloride: 93 mEq/L — ABNORMAL LOW (ref 96–112)
Creatinine, Ser: 1.31 mg/dL — ABNORMAL HIGH (ref 0.50–1.10)
GFR calc Af Amer: 41 mL/min — ABNORMAL LOW (ref >90)
GFR calc non Af Amer: 35 mL/min — ABNORMAL LOW (ref >90)
Glucose, Bld: 109 mg/dL — ABNORMAL HIGH (ref 70–99)
Total Bilirubin: 0.6 mg/dL (ref 0.3–1.2)

## 2013-04-07 LAB — BASIC METABOLIC PANEL
BUN: 49 mg/dL — ABNORMAL HIGH (ref 6–23)
Calcium: 8.8 mg/dL (ref 8.4–10.5)
GFR calc Af Amer: 44 mL/min — ABNORMAL LOW (ref >90)
GFR calc non Af Amer: 38 mL/min — ABNORMAL LOW (ref >90)
Potassium: 3.2 mEq/L — ABNORMAL LOW (ref 3.5–5.1)
Sodium: 133 mEq/L — ABNORMAL LOW (ref 135–145)

## 2013-04-07 MED ORDER — POTASSIUM CHLORIDE CRYS ER 20 MEQ PO TBCR
40.0000 meq | EXTENDED_RELEASE_TABLET | ORAL | Status: AC
  Administered 2013-04-07 (×2): 40 meq via ORAL
  Filled 2013-04-07 (×2): qty 2

## 2013-04-07 MED ORDER — FUROSEMIDE 40 MG PO TABS: 40.0000 mg | ORAL_TABLET | Freq: Two times a day (BID) | ORAL | Status: DC

## 2013-04-07 MED ORDER — POTASSIUM CHLORIDE CRYS ER 20 MEQ PO TBCR
40.0000 meq | EXTENDED_RELEASE_TABLET | Freq: Two times a day (BID) | ORAL | Status: AC
  Administered 2013-04-07: 40 meq via ORAL
  Filled 2013-04-07: qty 2

## 2013-04-07 NOTE — Evaluation (Signed)
Physical Therapy Mobility and Vestibular Evaluation Patient Details Name: [name] MRN: [id number] DOB: [birth year] Today's Date: 04/07/2013 Time: 4696-2952 PT Time Calculation (min): 42 min  PT Assessment / Plan / Recommendation History of Present Illness  Patient is an 79 y/o female admitted with dizziness.  Had just been started on new diuretic per kidney MD and also presented with hyponatremia, hypochloremia, and hypokalemia.  Clinical Impression  Patient presents with decreased balance, and motion sensitivity with reported h/o vertigo (lack of evidence with vestibular testing this date points to possibility of migraine related vertigo versus obvious medical causes.)  Feel she will benefit from HHPT for balance training and motion sensitivity training as well as for safety in the home.  No further acute needs due to possible d/c home today.  Did recommend to pt to use walker in the home for fall prevention.    PT Assessment  All further PT needs can be met in the next venue of care    Follow Up Recommendations  Home health PT;Supervision/Assistance - 24 hour          Equipment Recommendations  None recommended by PT          Precautions / Restrictions Precautions Precautions: Fall   Pertinent Vitals/Pain Denies pain currently      Mobility  Bed Mobility Bed Mobility: Supine to Sit;Sit to Supine Supine to Sit: 6: Modified independent (Device/Increase time) Sit to Supine: 6: Modified independent (Device/Increase time) Transfers Transfers: Sit to Stand;Stand to Sit Sit to Stand: 6: Modified independent (Device/Increase time);From bed Stand to Sit: 6: Modified independent (Device/Increase time);To bed Ambulation/Gait Ambulation/Gait Assistance: 5: Supervision;4: Min guard Ambulation Distance (Feet): 200 Feet Assistive device: None Ambulation/Gait Assistance Details: loss of balance noted with head turns and pt reports 8/10 dizziness; cues for visual compensatory  technique with target straight ahead and stopping to gain new target with turns Gait Pattern: Step-through pattern;Decreased stride length;Narrow base of support    Vestibular Exam Oculomotor: demonstrates smooth pursuits and saccades WNL, except pt c/o difficulty maintaining end gaze esp without glasses.  Has eye alignment WNL, reports h/o visual migraine issues with difficulty seeing in certain spots. Vestibular: able to demonstrate vestibular ocular reflex WNL vertical and horizontal x 30 seconds with dizziness 2-3/10 with horizontal head turns, occasional cues to maintain target, horizontal head shake test negataive for symptoms or nystagmus, head thrust test negative for refixation, VOR cancellation WNL, Positional: modified dix hallpike right and left negative for nystamus or symptoms in position, pt reports 3/10 dizziness up from right sidelying.   PT Diagnosis: Abnormality of gait  PT Problem List: Decreased balance;Other (comment);Decreased knowledge of use of DME;Decreased safety awareness (dizziness) PT Treatment Interventions:       PT Goals(Current goals can be found in the care plan section) Acute Rehab PT Goals PT Goal Formulation: No goals set, d/c therapy  Subjective:     Patient describes symptoms as her head spinning worse with mobility and not going away with movement.  Has had positional sounding vertigo in the past        Which occurred with sitting up and lasted few moments.  Reports also h/o migraine with recent onset of "migraine eyeball" per her report.  States increased       Recent imbalance at home without noted falls.  Visit Information  Last PT Received On: 04/07/13 Assistance Needed: +1 History of Present Illness: Patient is an 79 y/o female admitted with dizziness.  Had just been started on new diuretic  per kidney MD and also presented with hyponatremia, hypochloremia, and hypokalemia.       Prior Functioning  Home Living Family/patient expects to be  discharged to:: Private residence Living Arrangements: Spouse/significant other Available Help at Discharge: Available PRN/intermittently Type of Home: House Home Access: Stairs to enter;Ramped entrance Entrance Stairs-Number of Steps: ramp at back door, 5 steps in front (pt uses ramp) Home Layout: One level Home Equipment: Walker - 2 wheels Prior Function Level of Independence: Independent Communication Communication: No difficulties Dominant Hand: Right    Cognition  Cognition Arousal/Alertness: Awake/alert Behavior During Therapy: WFL for tasks assessed/performed Overall Cognitive Status: Within Functional Limits for tasks assessed    Extremity/Trunk Assessment Lower Extremity Assessment Lower Extremity Assessment: RLE deficits/detail;LLE deficits/detail RLE Deficits / Details: AROM WFL, strength hip flexion 3+/5, knee extension 4/5, ankle dorsiflexion 4/5, c/o; heaviness and edema in legs noted multiple areas of bruising and skin tears (pt reports very easy to bruise.) LLE Deficits / Details: AROM WFL, strength hip flexion 3+/5, knee extension 4/5, ankle dorsiflexion 4/5, c/o; heaviness and edema in legs noted multiple areas of bruising and skin tears (pt reports very easy to bruise.)   Balance Balance Balance Assessed: Yes Static Standing Balance Static Standing - Balance Support: No upper extremity supported Static Standing - Level of Assistance: 5: Stand by assistance  End of Session PT - End of Session Equipment Utilized During Treatment: Gait belt Activity Tolerance: Patient tolerated treatment well Patient left: in bed;with call bell/phone within reach;with bed alarm set  GP Functional Assessment Tool Used: clinical observation Functional Limitation: Self care Self Care Current Status (Z6109): At least 20 percent but less than 40 percent impaired, limited or restricted Self Care Goal Status (U0454): At least 1 percent but less than 20 percent impaired, limited or  restricted Self Care Discharge Status [phone]): At least 1 percent but less than 20 percent impaired, limited or restricted   [facility] 04/07/2013, 3:40 PM  [place], [place] [phone] 04/07/2013

## 2013-04-07 NOTE — Progress Notes (Signed)
   CARE MANAGEMENT NOTE 04/07/2013  Patient:  [name], [name]   Account Number:  [id number]  Date Initiated:  04/07/2013  Documentation initiated by:  [facility]  Subjective/Objective Assessment:   Adm w/dizziness     Action/Plan:   Anticipated DC Date:  04/07/2013   Anticipated DC Plan:  HOME W HOME HEALTH SERVICES      DC Planning Services  CM consult      [facility] Choice  HOME HEALTH   Choice offered to / List presented to:  C-4 Adult Children        HH arranged  HH-2 PT  HH-3 OT  HH-4 NURSE'S AIDE      HH agency  [facility]   Status of service:  In process, will continue to follow Medicare Important Message given?   (If response is "NO", the following Medicare IM given date fields will be blank) Date Medicare IM given:   Date Additional Medicare IM given:    Discharge Disposition:  HOME W HOME HEALTH SERVICES  Per UR Regulation:    If discussed at Long Length of Stay Meetings, dates discussed:    Comments:  04-07-13 17:45 Spoke with patient's daughter [name] [phone] who states family has had [facility] in the past however when [place] was called PT not available for 10 days; daughter, Alona Bene informed of the [place] delay and the family then chose [facility].  Facesheet and home health orders were faxed to [facility] and [facility] was called with referral. Freddy Jaksch, BSN, CM

## 2013-05-04 ENCOUNTER — Institutional Professional Consult (permissible substitution): Payer: Medicare Other | Admitting: Cardiology

## 2013-05-17 ENCOUNTER — Encounter (INDEPENDENT_AMBULATORY_CARE_PROVIDER_SITE_OTHER): Payer: Medicare Other

## 2013-05-17 DIAGNOSIS — I714 Abdominal aortic aneurysm, without rupture: Secondary | ICD-10-CM

## 2013-05-17 DIAGNOSIS — I700 Atherosclerosis of aorta: Secondary | ICD-10-CM

## 2013-05-17 DIAGNOSIS — I701 Atherosclerosis of renal artery: Secondary | ICD-10-CM

## 2013-07-05 ENCOUNTER — Other Ambulatory Visit: Payer: Self-pay

## 2014-06-03 ENCOUNTER — Telehealth: Payer: Self-pay | Admitting: Cardiology

## 2014-06-03 NOTE — Telephone Encounter (Signed)
Received 17 pages records from [facility] for appointment with Dr Percival Spanish on 06/14/14.  Records given too N. Hines for Schedule 06/14/14.  Received via fax on 06/03/14 lp

## 2014-06-10 ENCOUNTER — Encounter: Payer: Self-pay | Admitting: *Deleted

## 2014-06-14 ENCOUNTER — Ambulatory Visit (INDEPENDENT_AMBULATORY_CARE_PROVIDER_SITE_OTHER): Payer: Medicare Other | Admitting: Cardiology

## 2014-06-14 ENCOUNTER — Encounter: Payer: Self-pay | Admitting: Cardiology

## 2014-06-14 VITALS — BP 160/80 | HR 78 | Ht 62.0 in | Wt 107.1 lb

## 2014-06-14 DIAGNOSIS — R0600 Dyspnea, unspecified: Secondary | ICD-10-CM

## 2014-06-14 NOTE — Patient Instructions (Signed)
Your physician recommends that you schedule a follow-up appointment in:  One year with Dr. Percival Spanish  We are ordering an Echo  We have ordered labs for you to get done today

## 2014-06-14 NOTE — Progress Notes (Signed)
HPI The patient presents as a new patient. He was previously seen by Dr. Lia Foyer but has not been seen here in greater than 3 years.  She has a history of distant CABG. Her last stress test was apparently in 2009 although I can't find the results. She has been having increased dyspnea. This was happening for several days. He thought it came on somewhat abruptly. He was having shortness of breath with activities but she wasn't describing PND or orthopnea. I did review some blood pressure readings and her blood pressure was up slightly during this time. However, on one of the days she also noted her heart rate to the 144 her blood pressure cuff. She did not have new chest pressure and she didn't feel her heart racing or skipping. She didn't have any presyncope or syncope. She saw her primary provider and her heart rate was back down that day. However, he did start her on Cardizem 240 mg for presumed dysrhythmia. Unfortunately she couldn't tolerate this and had severe stomach discomfort. Since that time she's not noticed her heart rate is elevated but she has had continued dyspnea though not as severe. She has a chronic chest soreness and doesn't report that this is any different.  Allergies  Allergen Reactions  . Contrast Media [Iodinated Diagnostic Agents] Itching  . Amlodipine Besylate   . Aspirin Swelling  . Cefdinir Nausea And Vomiting  . Cephalexin Nausea And Vomiting  . Codeine Nausea And Vomiting and Other (See Comments)    dizziness  . Doxycycline Hyclate Nausea And Vomiting  . Erythromycin Nausea And Vomiting  . Levofloxacin Nausea And Vomiting  . Mometasone   . Peanut-Containing Drug Products Nausea And Vomiting  . Progesterone   . Propoxyphene N-Acetaminophen   . Valsartan     Current Outpatient Prescriptions  Medication Sig Dispense Refill  . acetaminophen (TYLENOL) 500 MG tablet Take 500 mg by mouth every 6 (six) hours as needed for pain.      . ACTONEL 35 MG tablet Take 35 mg  by mouth every Saturday.       Marland Kitchen allopurinol (ZYLOPRIM) 100 MG tablet Take 200 mg by mouth daily.       Marland Kitchen atorvastatin (LIPITOR) 40 MG tablet Take 40 mg by mouth every evening.      . bisacodyl (DULCOLAX) 10 MG suppository Place 10 mg rectally daily as needed for constipation.      . carboxymethylcellulose (REFRESH CELLUVISC) 1 % ophthalmic solution Apply 1 drop to eye as needed.      . conjugated estrogens (PREMARIN) vaginal cream Place vaginally every Tuesday.      . diphenhydrAMINE (BENADRYL) 25 MG tablet Take 25 mg by mouth as needed.      . ezetimibe (ZETIA) 10 MG tablet Take 10 mg by mouth daily.      . furosemide (LASIX) 40 MG tablet Take 1 tablet (40 mg total) by mouth 2 (two) times daily.  60 tablet  0  . guaiFENesin (MUCINEX) 600 MG 12 hr tablet Take 600 mg by mouth daily.      Marland Kitchen levothyroxine (SYNTHROID, LEVOTHROID) 75 MCG tablet Take 75 mcg by mouth daily.      Marland Kitchen loratadine (CLARITIN) 10 MG tablet Take 10 mg by mouth daily.      Marland Kitchen losartan (COZAAR) 25 MG tablet       . potassium chloride SA (K-DUR,KLOR-CON) 20 MEQ tablet Take 20 mEq by mouth 4 (four) times daily.      . psyllium (  REGULOID) 0.52 G capsule Take 1.56 g by mouth daily.       No current facility-administered medications for this visit.    Past Medical History  Diagnosis Date  . Anemia   . Arthritis   . CKD (chronic kidney disease)   . Cancer   . Fall     Syncope secondary to low BP.  Injured back.   . Hyperlipidemia   . Hypertension   . Osteoporosis   . Thyroid disease   . AAA (abdominal aortic aneurysm)   . PVD (peripheral vascular disease)     Past Surgical History  Procedure Laterality Date  . Appendectomy    . Coronary artery bypass graft      2000  . Skin cancer resected    . Breast biopsy      Family History  Problem Relation Age of Onset  . CAD Mother   . CAD Father     History   Social History  . Marital Status: Married    Spouse Name: N/A    Number of Children: 1  . Years of  Education: N/A   Occupational History  . Not on file.   Social History Main Topics  . Smoking status: Former Research scientist (life sciences)  . Smokeless tobacco: Not on file  . Alcohol Use: No  . Drug Use: No  . Sexual Activity: Not on file   Other Topics Concern  . Not on file   Social History Narrative   Lives with husband          ROS:  As stated in the HPI and negative for all other systems.   PHYSICAL EXAM BP 160/80  Pulse 78  Ht 5\' 2"  (1.575 m)  Wt 107 lb 1.6 oz (48.58 kg)  BMI 19.58 kg/m2 GENERAL:  Well appearing, frail HEENT:  Pupils equal round and reactive, fundi not visualized, oral mucosa unremarkable NECK:  No jugular venous distention, waveform within normal limits, carotid upstroke brisk and symmetric, bilateral bruits, no thyromegaly LYMPHATICS:  No cervical, inguinal adenopathy LUNGS:  Clear to auscultation bilaterally BACK:  No CVA tenderness CHEST:  Well healed sternotomy scar. HEART:  PMI not displaced or sustained,S1 and S2 within normal limits, no S3, no S4, no clicks, no rubs, axillary and apical holosystolic murmur 3/6, no diastolic murmurs ABD:  Flat, positive bowel sounds normal in frequency in pitch, no bruits, no rebound, no guarding, no midline pulsatile mass, no hepatomegaly, no splenomegaly EXT:  2 plus pulses throughout, no edema, no cyanosis no clubbing SKIN:  No rashes no nodules NEURO:  Cranial nerves II through XII grossly intact, motor grossly intact throughout PSYCH:  Cognitively intact, oriented to person place and time   EKG:  Sinus rhythm, rate 70, axis within normal limits, right bundle branch block, anterior T-wave inversions probable repolarization. 06/14/2014   ASSESSMENT AND PLAN  DYSPNEA:  This certainly could be an anginal equivalent. It could be related to a dysrhythmia although she noted her heart rate to be normal when she first started having the symptoms. She wants conservative evaluation and treatment. I am going to start with a BNP  level and an echocardiogram to evaluate this. For now she will continue the meds as listed and one might consider further medical management. She would not want an invasive evaluation.  CAD:  As above she would prefer conservative therapy and would not want another cardiac catheterization. At this point I am not considering any perfusion imaging.  TACHYCARDIA:  She did seem to  have a tachycardia by her reporting. She couldn't tolerate a higher dose of Cardizem but I'm going to use 30 mg 3 times a day of immediate release. We will see how she does with this. She will let me know she has any increasing palpitations.  HTN:  This has been labile. We'll make med changes as described.

## 2014-06-15 LAB — BRAIN NATRIURETIC PEPTIDE: BRAIN NATRIURETIC PEPTIDE: 116.6 pg/mL — AB (ref 0.0–100.0)

## 2014-06-20 ENCOUNTER — Ambulatory Visit (HOSPITAL_COMMUNITY)
Admission: RE | Admit: 2014-06-20 | Discharge: 2014-06-20 | Disposition: A | Discharge status: Home / Self Care | Payer: Medicare Other | Source: Ambulatory Visit | Attending: Cardiology | Admitting: Cardiology

## 2014-06-20 DIAGNOSIS — R0600 Dyspnea, unspecified: Secondary | ICD-10-CM | POA: Insufficient documentation

## 2014-06-20 DIAGNOSIS — I359 Nonrheumatic aortic valve disorder, unspecified: Secondary | ICD-10-CM

## 2014-06-20 NOTE — Progress Notes (Signed)
2D Echo Performed 06/20/2014    Marygrace Drought, RCS

## 2014-08-06 IMAGING — CR DG CHEST 1V PORT
1 series · 1 of 1 positions shown · non-contrast
Comparison: 05/06/2011

CLINICAL DATA: Altered mental status, dehydration, heart murmur,
hypertension

PORTABLE CHEST - 1 VIEW

[AP]
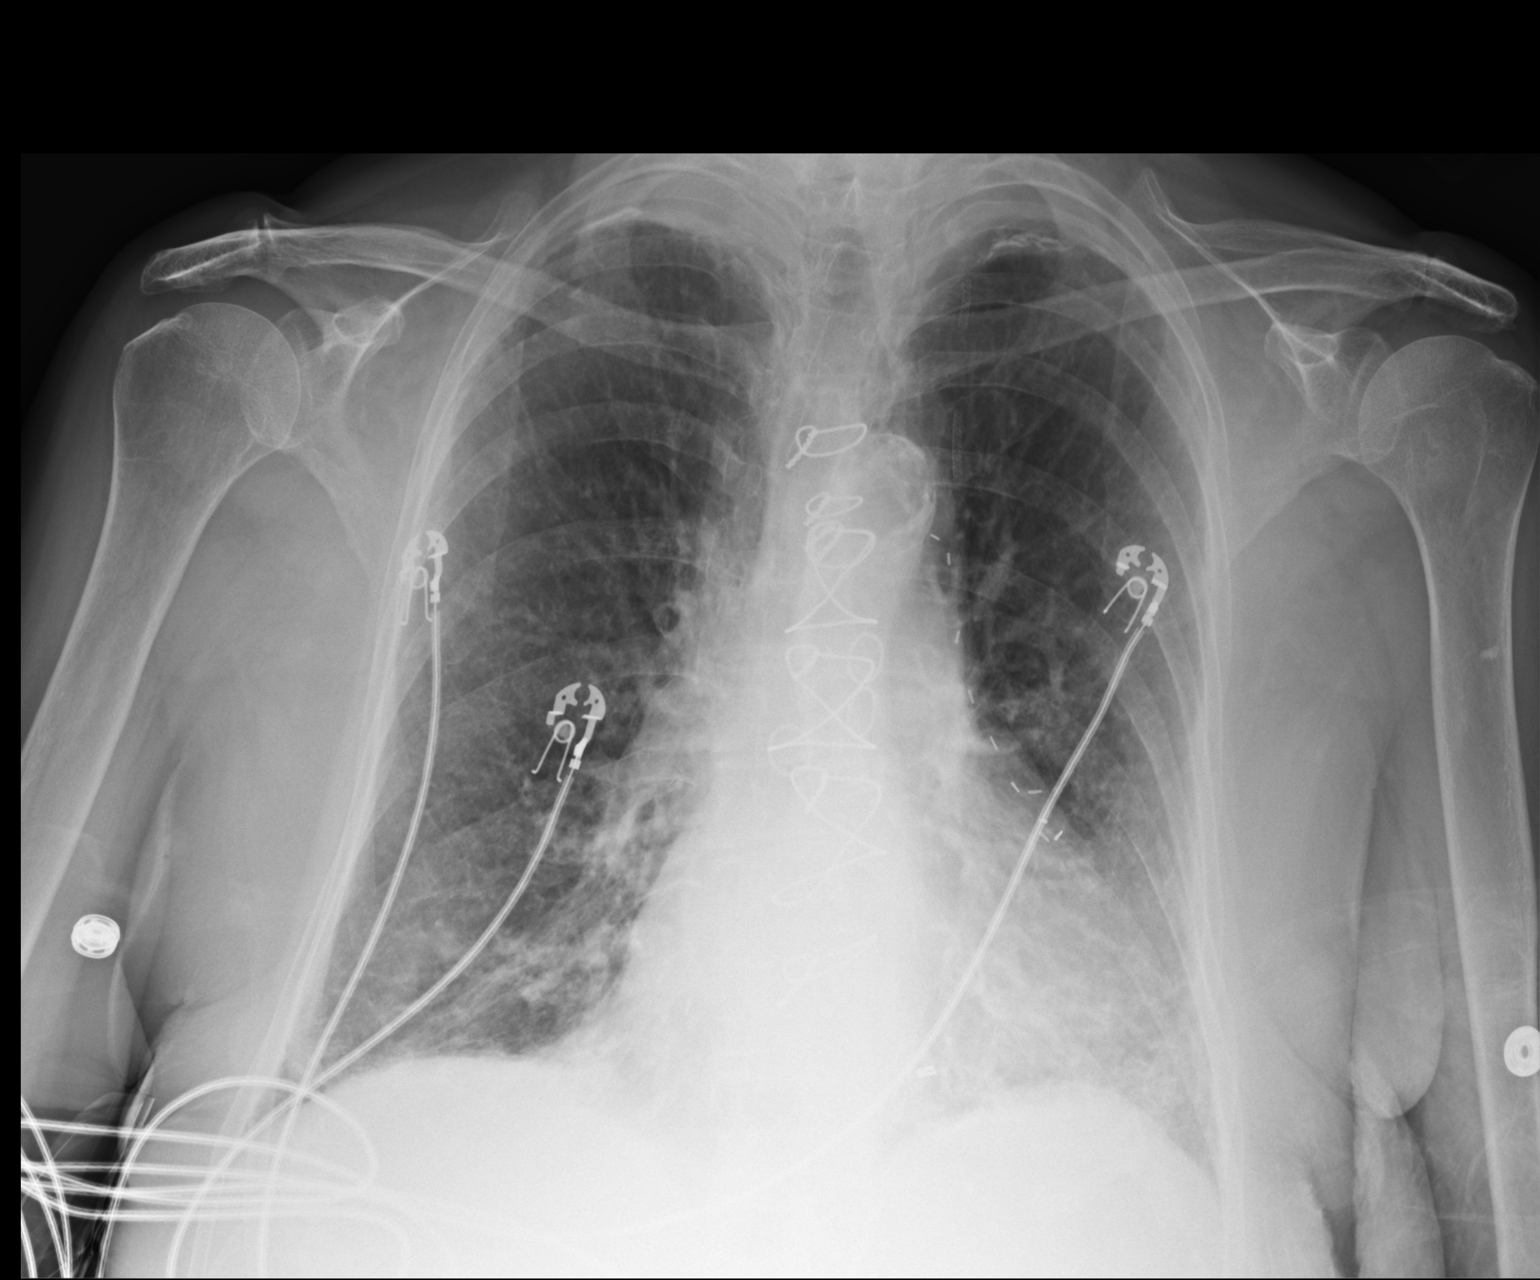

[1 of 1 positions shown; findings below may reference images not displayed]

FINDINGS: Prior coronary bypass changes noted.  Cardiomegaly
evident with chronic vascular congestion and basilar interstitial
changes, suspect fibrosis.  No definite superimposed CHF for
pneumonia.  No effusion or pneumothorax.  Atherosclerosis of the
aorta and subclavian arteries.  Chronic biapical pleural
parenchymal thickening.  No significant interval change.
IMPRESSION: Cardiomegaly with chronic vascular congestion and basilar
interstitial changes versus fibrosis.  No new finding.

## 2014-10-27 ENCOUNTER — Encounter (HOSPITAL_COMMUNITY): Payer: Self-pay | Admitting: Emergency Medicine

## 2014-10-27 ENCOUNTER — Observation Stay (HOSPITAL_COMMUNITY)
Admission: EM | Admit: 2014-10-27 | Discharge: 2014-10-27 | Discharge status: Against Medical Advice | Payer: Medicare Other | Attending: Internal Medicine | Admitting: Internal Medicine

## 2014-10-27 ENCOUNTER — Other Ambulatory Visit: Payer: Self-pay

## 2014-10-27 ENCOUNTER — Emergency Department (HOSPITAL_COMMUNITY): Payer: Medicare Other

## 2014-10-27 DIAGNOSIS — I739 Peripheral vascular disease, unspecified: Secondary | ICD-10-CM | POA: Insufficient documentation

## 2014-10-27 DIAGNOSIS — R531 Weakness: Secondary | ICD-10-CM | POA: Insufficient documentation

## 2014-10-27 DIAGNOSIS — N184 Chronic kidney disease, stage 4 (severe): Secondary | ICD-10-CM | POA: Diagnosis not present

## 2014-10-27 DIAGNOSIS — D649 Anemia, unspecified: Secondary | ICD-10-CM

## 2014-10-27 DIAGNOSIS — R55 Syncope and collapse: Secondary | ICD-10-CM

## 2014-10-27 DIAGNOSIS — I129 Hypertensive chronic kidney disease with stage 1 through stage 4 chronic kidney disease, or unspecified chronic kidney disease: Secondary | ICD-10-CM | POA: Diagnosis not present

## 2014-10-27 DIAGNOSIS — M1990 Unspecified osteoarthritis, unspecified site: Secondary | ICD-10-CM | POA: Diagnosis present

## 2014-10-27 DIAGNOSIS — R002 Palpitations: Secondary | ICD-10-CM | POA: Diagnosis present

## 2014-10-27 DIAGNOSIS — R0789 Other chest pain: Secondary | ICD-10-CM | POA: Diagnosis not present

## 2014-10-27 DIAGNOSIS — Z8249 Family history of ischemic heart disease and other diseases of the circulatory system: Secondary | ICD-10-CM | POA: Diagnosis not present

## 2014-10-27 DIAGNOSIS — M810 Age-related osteoporosis without current pathological fracture: Secondary | ICD-10-CM | POA: Diagnosis not present

## 2014-10-27 DIAGNOSIS — I2510 Atherosclerotic heart disease of native coronary artery without angina pectoris: Secondary | ICD-10-CM | POA: Insufficient documentation

## 2014-10-27 DIAGNOSIS — Z87891 Personal history of nicotine dependence: Secondary | ICD-10-CM | POA: Diagnosis not present

## 2014-10-27 DIAGNOSIS — R0602 Shortness of breath: Secondary | ICD-10-CM | POA: Diagnosis not present

## 2014-10-27 DIAGNOSIS — E785 Hyperlipidemia, unspecified: Secondary | ICD-10-CM | POA: Diagnosis not present

## 2014-10-27 DIAGNOSIS — I209 Angina pectoris, unspecified: Secondary | ICD-10-CM

## 2014-10-27 DIAGNOSIS — I252 Old myocardial infarction: Secondary | ICD-10-CM | POA: Insufficient documentation

## 2014-10-27 DIAGNOSIS — N183 Chronic kidney disease, stage 3 unspecified: Secondary | ICD-10-CM | POA: Diagnosis present

## 2014-10-27 DIAGNOSIS — E039 Hypothyroidism, unspecified: Secondary | ICD-10-CM | POA: Diagnosis present

## 2014-10-27 DIAGNOSIS — R7989 Other specified abnormal findings of blood chemistry: Secondary | ICD-10-CM

## 2014-10-27 DIAGNOSIS — R079 Chest pain, unspecified: Secondary | ICD-10-CM | POA: Diagnosis present

## 2014-10-27 DIAGNOSIS — Z951 Presence of aortocoronary bypass graft: Secondary | ICD-10-CM | POA: Insufficient documentation

## 2014-10-27 DIAGNOSIS — Z955 Presence of coronary angioplasty implant and graft: Secondary | ICD-10-CM | POA: Insufficient documentation

## 2014-10-27 DIAGNOSIS — I10 Essential (primary) hypertension: Secondary | ICD-10-CM | POA: Diagnosis present

## 2014-10-27 DIAGNOSIS — R61 Generalized hyperhidrosis: Secondary | ICD-10-CM

## 2014-10-27 HISTORY — DX: Hypothyroidism, unspecified: E03.9

## 2014-10-27 LAB — BASIC METABOLIC PANEL
Anion gap: 6 (ref 5–15)
BUN: 66 mg/dL — ABNORMAL HIGH (ref 6–23)
CO2: 25 mmol/L (ref 19–32)
CREATININE: 1.75 mg/dL — AB (ref 0.50–1.10)
Calcium: 9 mg/dL (ref 8.4–10.5)
Chloride: 107 mmol/L (ref 96–112)
GFR calc Af Amer: 29 mL/min — ABNORMAL LOW (ref >90)
GFR calc non Af Amer: 25 mL/min — ABNORMAL LOW (ref >90)
GLUCOSE: 110 mg/dL — AB (ref 70–99)
POTASSIUM: 4.5 mmol/L (ref 3.5–5.1)
SODIUM: 138 mmol/L (ref 135–145)

## 2014-10-27 LAB — CBC
HCT: 30.8 % — ABNORMAL LOW (ref 36.0–46.0)
HEMOGLOBIN: 9.9 g/dL — AB (ref 12.0–15.0)
MCH: 32.7 pg (ref 26.0–34.0)
MCHC: 32.1 g/dL (ref 30.0–36.0)
MCV: 101.7 fL — AB (ref 78.0–100.0)
Platelets: 206 10*3/uL (ref 150–400)
RBC: 3.03 MIL/uL — AB (ref 3.87–5.11)
RDW: 14.9 % (ref 11.5–15.5)
WBC: 9.7 10*3/uL (ref 4.0–10.5)

## 2014-10-27 LAB — MAGNESIUM: Magnesium: 2.1 mg/dL (ref 1.5–2.5)

## 2014-10-27 LAB — I-STAT TROPONIN, ED: TROPONIN I, POC: 0.03 ng/mL (ref 0.00–0.08)

## 2014-10-27 LAB — TSH: TSH: 2.04 u[IU]/mL (ref 0.350–4.500)

## 2014-10-27 LAB — BRAIN NATRIURETIC PEPTIDE: B NATRIURETIC PEPTIDE 5: 198.3 pg/mL — AB (ref 0.0–100.0)

## 2014-10-27 MED ORDER — DIPHENHYDRAMINE HCL 25 MG PO TABS: 25.0000 mg | ORAL_TABLET | ORAL | Status: DC | PRN

## 2014-10-27 MED ORDER — LEVOTHYROXINE SODIUM 75 MCG PO TABS: 75.0000 ug | ORAL_TABLET | Freq: Every day | ORAL | Status: DC

## 2014-10-27 MED ORDER — FUROSEMIDE 20 MG PO TABS: 40.0000 mg | ORAL_TABLET | Freq: Two times a day (BID) | ORAL | Status: DC

## 2014-10-27 MED ORDER — ATORVASTATIN CALCIUM 40 MG PO TABS: 40.0000 mg | ORAL_TABLET | Freq: Every evening | ORAL | Status: DC

## 2014-10-27 MED ORDER — LORATADINE 10 MG PO TABS: 10.0000 mg | ORAL_TABLET | Freq: Every day | ORAL | Status: DC

## 2014-10-27 MED ORDER — DIPHENHYDRAMINE HCL 12.5 MG/5ML PO ELIX
12.5000 mg | ORAL_SOLUTION | Freq: Four times a day (QID) | ORAL | Status: DC | PRN
  Filled 2014-10-27: qty 10

## 2014-10-27 MED ORDER — ALLOPURINOL 100 MG PO TABS: 200.0000 mg | ORAL_TABLET | Freq: Every day | ORAL | Status: DC

## 2014-10-27 MED ORDER — CARBOXYMETHYLCELLULOSE SODIUM 1 % OP SOLN: 1.0000 [drp] | OPHTHALMIC | Status: DC | PRN

## 2014-10-27 MED ORDER — BISACODYL 10 MG RE SUPP: 10.0000 mg | Freq: Every day | RECTAL | Status: DC | PRN

## 2014-10-27 MED ORDER — ACETAMINOPHEN 500 MG PO TABS: 500.0000 mg | ORAL_TABLET | Freq: Four times a day (QID) | ORAL | Status: DC | PRN

## 2014-10-27 MED ORDER — DIPHENHYDRAMINE HCL 12.5 MG/5ML PO ELIX: 12.5000 mg | ORAL_SOLUTION | Freq: Once | ORAL | Status: DC

## 2014-10-27 MED ORDER — HYDRALAZINE HCL 20 MG/ML IJ SOLN
10.0000 mg | INTRAMUSCULAR | Status: DC | PRN
  Filled 2014-10-27: qty 1

## 2014-10-27 MED ORDER — LOSARTAN POTASSIUM 50 MG PO TABS: 50.0000 mg | ORAL_TABLET | Freq: Every day | ORAL | Status: DC

## 2014-10-27 MED ORDER — GUAIFENESIN ER 600 MG PO TB12: 600.0000 mg | ORAL_TABLET | Freq: Every day | ORAL | Status: DC

## 2014-10-27 MED ORDER — PSYLLIUM 0.52 G PO CAPS: 1.5600 g | ORAL_CAPSULE | Freq: Every day | ORAL | Status: DC

## 2014-10-27 MED ORDER — EZETIMIBE 10 MG PO TABS: 10.0000 mg | ORAL_TABLET | Freq: Every day | ORAL | Status: DC

## 2014-10-27 MED ORDER — CLOPIDOGREL BISULFATE 75 MG PO TABS: 75.0000 mg | ORAL_TABLET | Freq: Every day | ORAL | Status: DC

## 2014-10-27 NOTE — ED Notes (Signed)
Doctor and nurse explained risk and benefits of being admitted to hospital and leaving [place]. Verbalized understanding.

## 2014-10-27 NOTE — ED Notes (Addendum)
Patient stated " I am 81 years old and I will not have another stent placed, I want to go home".  The patient and daughter are both adamant about going home and not staying.  Informed Dr. Haroldine Laws of the patient's wishes.  He verbalized understanding.  Md at bedside to discuss with patient.

## 2014-10-27 NOTE — ED Notes (Signed)
Patient stated around 12:00 chest pain started on the left side with "heart flutters".  Since then patient is sitting comfortable with a pain rating of a 2.

## 2014-10-27 NOTE — ED Provider Notes (Addendum)
I saw and evaluated the patient, reviewed the resident's note and I agree with the findings and plan.     EKG Interpretation   Date/Time:  Sunday October 27 2014 13:58:54 EST Ventricular Rate:  92 PR Interval:  196 QRS Duration: 124 QT Interval:  388 QTC Calculation: 479 R Axis:   103 Text Interpretation:  Normal sinus rhythm Right bundle branch block  Abnormal ECG OLD RBBB NO SIG CHANGE Confirmed by Pfeiffer, MD, Marcy  ([id number]) on 10/27/2014 2:07:49 PM      81  yo female with hx of CAD presenting with chest pain, diaphoresis, weakness which have now resolved.  On exam, well appearing, nontoxic, not distressed, normal respiratory effort, normal perfusion, heart sounds normal with RRR, II/VI systolic murmur, lungs CTAB.  Initial labs reassuring.  Plan consult cardiology.    Admission planned, but pt ultimately decided to leave AMA.    Clinical Impression: 1. Chest pain, unspecified chest pain type       Houston Siren III, MD 10/28/14 [place] III, MD 10/28/14 [phone]

## 2014-10-27 NOTE — ED Notes (Signed)
Dr Haroldine Laws spoke with patient and family member patient wants to go home and will discontinue medication and discharge patient.

## 2014-10-27 NOTE — ED Notes (Signed)
Admit Doctor at bedside.  

## 2014-10-27 NOTE — Discharge Instructions (Signed)
As we discussed, I would recommend admission at this time. It is very possible that your pain and symptoms were due to a minor or major heart attack. Leaving at this time could result in serious disability and death. If you have any other symptoms or if you decide to pursue an admission, please return to the ED for admission. We are happy to treat you.

## 2014-10-27 NOTE — ED Notes (Signed)
Pt c/o center chest heaviness onset today while at church. Pt reports diaphoresis, shortness of breath and feeling a flutter in her chest.

## 2014-10-27 NOTE — Consult Note (Signed)
Referring Physician:  Primary Physician: Nicoletta Dress, MD Primary Cardiologist: [facility], wishes to switch Reason for Consultation: palpitations, weakness  HPI: 81 yo female w/ hx CABG 2000, s/p cath 2004 w/ BMS x 2 to the LIMA-LAD, no stress test since then. Hx HTN, HLD, hypothyroid. Multiple allergies including IV dye, ASA, and polyester.   Pt was in her usual [facility] today. She is still having trouble with her neck since she had syncope (after taking morning meds, was standing still, asymptomatic) and fell 06/2013. She had pain (her usual) in her upper back this am after activity and laid down about 15 minutes. She was then OK and went to church.   She stood up to sing at the end of the service, and felt funny. She was diaphoretic, SOB and became very weak. She was helped to the car. She felt palpitations and felt her heart skipping and racing. After she got home, she developed chest heaviness, 2/10. She was not SOB at that time. She rested and the symptoms improved. Her family wanted her to be checked and she came to the ER. She is itching from the polyester sheets and has extra high BP from "white coat syndrome", chronic problem. Other than that, she feels OK. She has only had palpitations once before, she was asymptomatic from them and they were never evaluated. She has had no recent chest pain, but her activity is very limited due to her MS problems.  Review of Systems:     Cardiac Review of Systems: {Y] = yes [ ]  = no  Chest Pain [  y  ]  Resting SOB [   ] Exertional SOB  [  ]  Orthopnea [  ]   Pedal Edema [   ]    Palpitations Blue.Reese  ] Syncope  [  ]   Presyncope [ y  ]  General Review of Systems: [Y] = yes [  ]=no Constitional: recent weight change [  ]; anorexia [  ]; fatigue [  ]; nausea [  ]; night sweats [  ]; fever [  ]; or chills [  ];                                                                                                                                          Dental:  poor dentition[ y ];   Eye : blurred vision [  ]; diplopia [   ]; vision changes [  ];  Amaurosis fugax[  ]; Resp: cough [  ];  wheezing[  ];  hemoptysis[  ]; shortness of breath[  ]; paroxysmal nocturnal dyspnea[  ]; dyspnea on exertion[ y ]; or orthopnea[  ];  GI:  gallstones[  ], vomiting[  ];  dysphagia[  ]; melena[  ];  hematochezia [  ]; heartburn[  ];   Hx of  Colonoscopy[  ]; GU:  kidney stones [  ]; hematuria[  ];   dysuria [  ];  nocturia[  ];  history of     obstruction [  ];                 Skin: rash, swelling[  ];, hair loss[  ];  peripheral edema[  ];  or itching[  ]; Musculosketetal: myalgias[  ];  joint swelling[  ];  joint erythema[  ];  joint pain[y  ];  back pain[y  ];  Heme/Lymph: bruising[  ];  bleeding[  ];  anemia[  ];  Neuro: TIA[  ];  headaches[  ];  stroke[  ];  vertigo[  ];  seizures[  ];   paresthesias[  ];  difficulty walking[  ];  Psych:depression[  ]; anxiety[  ];  Endocrine: diabetes[  ];  thyroid dysfunction[ y ];  Other:  Past Medical History  Diagnosis Date  . Anemia   . Arthritis   . CKD (chronic kidney disease)   . Cancer   . Fall     Syncope secondary to low BP.  Injured back.   . Hyperlipidemia   . Hypertension   . Osteoporosis   . Hypothyroid   . AAA (abdominal aortic aneurysm)   . PVD (peripheral vascular disease)    Past Surgical History  Procedure Laterality Date  . Appendectomy    . Coronary artery bypass graft      2000  . Skin cancer resected    . Breast biopsy    . Coronary angioplasty with stent placement  2004    BMS x 2 to LIMA-LAD   Allergies  Allergen Reactions  . Contrast Media [Iodinated Diagnostic Agents] Itching  . Amlodipine Besylate   . Aspirin Swelling  . Cefdinir Nausea And Vomiting  . Cephalexin Nausea And Vomiting  . Codeine Nausea And Vomiting and Other (See Comments)    dizziness  . Doxycycline Hyclate Nausea And Vomiting  . Erythromycin Nausea And Vomiting  . Levofloxacin Nausea And Vomiting  .  Mometasone   . Peanut-Containing Drug Products Nausea And Vomiting  . Progesterone   . Propoxyphene N-Acetaminophen   . Valsartan     History   Social History  . Marital Status: Married    Spouse Name: N/A  . Number of Children: 1  . Years of Education: N/A   Occupational History  . Retired    Social History Main Topics  . Smoking status: Former Research scientist (life sciences)  . Smokeless tobacco: Not on file  . Alcohol Use: No  . Drug Use: No  . Sexual Activity: Not on file   Other Topics Concern  . Not on file   Social History Narrative   Lives with husband    Family History  Problem Relation Age of Onset  . CAD Mother   . CAD Father    Family Status  Relation Status Death Age  . Mother Deceased   . Father Deceased     PHYSICAL EXAM: Filed Vitals:   10/27/14 1534  BP: 140/84  Pulse: 86  Temp: 97.9 F (36.6 C)  Resp:     No intake or output data in the 24 hours ending 10/27/14 1711  General:  Elderly. No respiratory difficulty HEENT: normal Neck: supple. no JVD. Carotids 2+ bilat; + radiated bruits. No lymphadenopathy or thryomegaly appreciated. Cor: PMI nondisplaced. Regular rate & rhythm. 3/6 SEM RSB S2 ok Lungs: clear Abdomen: soft, nontender, nondistended. No hepatosplenomegaly. No bruits or masses. Good bowel sounds.  Extremities: no cyanosis, clubbing, rash, tr edema with some discoloration Neuro: alert & oriented x 3, cranial nerves grossly intact. moves all 4 extremities w/o difficulty. Affect pleasant.  ECG: NSR RBBB No ST-T wave abnormalities.    ECHO:06/20/2014 - Left ventricle: The cavity size was normal. Wall thickness was increased in a pattern of moderate LVH. Systolic function was vigorous. The estimated ejection fraction was in the range of 65% to 70%. Doppler parameters are consistent with abnormal left ventricular relaxation (grade 1 diastolic dysfunction). - Aortic valve: AV is thickened, calcified with very mildly restricted motion Peak  and mean gradients through the valve are 29 and 13 mm Hg respectively. There was mild regurgitation. Valve area (VTI): 1.16 cm^2. Valve area (Vmax): 0.88 cm^2. - Mitral valve: Calcified annulus. Moderately thickened, mildly calcified leaflets . There was mild regurgitation. Valve area by continuity equation (using LVOT flow): 1.68 cm^2.  Cardiac Cath: 2004 ANGIOGRAPHIC DATA: 1. Ventriculography was performed in the RAO projection. Overall systolic  function was vigorous. There was some ectopy and there appeared to be at  least some mild mitral regurgitation. However, it was somewhat difficult  because of the ectopy, to be certain. 2. Distal aortography revealed what the previous study had demonstrated,  which was a small aneurysmal bulge at the distal most aspect of the  aorta. There was diffuse atherosclerotic change of the aorta, that does  not appear to be flow limiting in the infrarenal area. The renal arteries  are difficult to see but appear to be reasonably patent but accurate  common __________ could not be made about the renal arteries on this  study. 3. The right coronary artery, the previously placed Stent is diffusely  narrow throughout. There is evidence of bi-directional flow distally. The  saphenous vein graft to the distal right coronary inserts into the  posterior descending. This fills the PDA and then fills retrograde into  the posterolateral system. This appears to be widely patent. 4. The left main coronary artery is small. There does not appear to be a  focal dissection at the present time. 5. The LAD demonstrates about a 70% mid stenosis after several septal  perforators. It provides several more septal perforators and then  provides the distal LAD. The distal LAD beyond the graft insertion  appears to be small and probably diffusely diseased. Importantly, the  internal mammary graft appears to  fill retrograde through the LAD proper.  There also is a potential stenosis just prior to the graft insertion but  that does not appear to be critical. 6. The circumflex provides a tiny first marginal that is irregular but quite  small. There is a 75% stenosis in the circumflex, leading into the large  marginal, which trifurcates. There is evidence of competitive filling of  this, compatible with a graft. The saphenous vein graft to the OM does  appear to be widely patent. However, there is damping with the right  catheter in the ostium, although the ostium itself does not appear to be  critically narrow. It appears to be just about slightly larger than the  size of the diagnostic catheter, perhaps accounting for the damping. 7. There is a saphenous vein graft to 2 diagonal branches that is sequential  graft. The diagonal itself is seen as a thin, diffusely diseased vessel  coming off the native LAD, but there is noted competitive filling  proximally and the distal vessel is not seen on the original antegrade  injection. The saphenous vein graft appears  to be smooth throughout and  both insertions appear to be really quite good. It is difficult to be  absolutely certain about the second insertion, but the insertions into  the two distal diagonal branches appear to be adequate and there is no  area of high grade focal narrowing. Likewise, there is no obvious  evidence of ostial narrowing. 8. The internal mammary to the distal portion of the left anterior  descending artery has a 95% stenosis proximally. Just after the stenosis,  there is bi-directional filling. There is evidence of a second high grade  stenosis also in the mid portion of the graft. The graft itself fills  retrograde by injection of the native LAD. CONCLUSION: 1. Well preserved overall left ventricular function. 2. Infrarenal atherosclerosis with small  abdominal aneurysm as noted  previously. 3. Continued patency of the saphenous vein graft to the distal right  coronary. 4. Continued patency of the saphenous vein graft to the obtuse marginal with  mild ostial tapered narrowing, which is likely just graft size. 5. Continued patency of what appears to be a sequential saphenous vein graft  to diagonal branches. 6. Moderate disease of the left anterior descending artery with what appears  to be two high grade lesions in the internal mammary to the LAD. Stenting Cath Report Following the stenting of the proximal internal mammary stenosis of 90%, this was reduced to less than 10% and likewise the mid stenosis was reduced to less than 10% as well. However, the flow remained bidirectional in the internal mammary as previously noted after the above stenoses were relieved. However, the lesions themselves appeared to be widely patent. There also appears to be some narrowing of probably 50% involving the subclavian on the left. CONCLUSIONS: 1. Successful stenting of the 2 high-grade lesions of the internal mammary  artery as noted above. 2. A 70% stenosis of the mid left anterior descending artery prior to the  internal mammary with a probable noncritical stenosis just prior to the  insertion of the mammary in the native LAD with diffuse apical LAD  disease as noted above. DISPOSITION: As I explained to the family in detail, we were uncertain as to the best course of action. The patient has an anterior defect which I think was likely related to the LAD territory, but it is hard to be certain. She clearly had 2 high-grade lesions in the internal mammary leading into the distal LAD and bidirectional flow in this area. The LAD itself does not appear to be greatly progressed from the preoperative film in the midportion of the vessel.  With the retrograde filling of the LAD, I was a little reluctant  to go ahead and dilate this, as this overlies a number of septal perforators and the area does not appear to be dramatically progressed. With the 2 high-grade lesions in the internal mammary we felt that this was probably the best approach to try to preserve the mammary if it can be preserved for the long haul. However, there does not appear to be antegrade flow down the LAD, probably because of better flow down the native vessel. Hopefully the patient will stabilize with this and we will try to treat her with nitrates at the present time. Close follow up with me in the office will be required.  Results for orders placed or performed during the hospital encounter of 10/27/14 (from the past 24 hour(s))  CBC     Status: Abnormal   Collection Time: 10/27/14  2:17 PM  Result Value Ref Range   WBC 9.7 4.0 - 10.5 K/uL   RBC 3.03 (L) 3.87 - 5.11 MIL/uL   Hemoglobin 9.9 (L) 12.0 - 15.0 g/dL   HCT 30.8 (L) 36.0 - 46.0 %   MCV 101.7 (H) 78.0 - 100.0 fL   MCH 32.7 26.0 - 34.0 pg   MCHC 32.1 30.0 - 36.0 g/dL   RDW 14.9 11.5 - 15.5 %   Platelets 206 150 - 400 K/uL  Basic metabolic panel     Status: Abnormal   Collection Time: 10/27/14  2:17 PM  Result Value Ref Range   Sodium 138 135 - 145 mmol/L   Potassium 4.5 3.5 - 5.1 mmol/L   Chloride 107 96 - 112 mmol/L   CO2 25 19 - 32 mmol/L   Glucose, Bld 110 (H) 70 - 99 mg/dL   BUN 66 (H) 6 - 23 mg/dL   Creatinine, Ser 1.75 (H) 0.50 - 1.10 mg/dL   Calcium 9.0 8.4 - 10.5 mg/dL   GFR calc non Af Amer 25 (L) >90 mL/min   GFR calc Af Amer 29 (L) >90 mL/min   Anion gap 6 5 - 15  I-stat troponin, ED (not at [facility])     Status: None   Collection Time: 10/27/14  2:48 PM  Result Value Ref Range   Troponin i, poc 0.03 0.00 - 0.08 ng/mL   Comment 3          Magnesium     Status: None   Collection Time: 10/27/14  3:11 PM  Result Value Ref Range   Magnesium 2.1 1.5 - 2.5 mg/dL  Brain natriuretic peptide     Status: Abnormal   Collection Time:  10/27/14  3:11 PM  Result Value Ref Range   B Natriuretic Peptide 198.3 (H) 0.0 - 100.0 pg/mL   Radiology:  Dg Chest 2 View (if Patient Has Fever And/or Copd) 10/27/2014   CLINICAL DATA:  Acute chest pain.  EXAM: CHEST  2 VIEW  COMPARISON:  July 06, 2013.  FINDINGS: Stable cardiomediastinal silhouette. Status post coronary artery bypass graft. No pneumothorax or pleural effusion is noted. Stable interstitial densities are noted throughout both lungs most consistent with scarring or fibrosis. No acute pulmonary disease is noted. Degenerative disc disease is noted in the mid thoracic spine. Stable biapical scarring.  IMPRESSION: Stable chronic findings as described above. No acute cardiopulmonary abnormality seen.   Electronically Signed   By: Marijo Conception, M.D.   On: 10/27/2014 15:23    ASSESSMENT: Principal Problem: 1.  Chest pain Active Problems: 2.  Hypothyroidism - TSH OK today 3.  Essential hypertension 4.  Osteoarthritis 5.  CKD (chronic kidney disease)  PLAN/DISCUSSION: Ms Gladue had chest pain today, unusual for her. It started after an episode of presyncope and tachy-palpitations. Her symptoms resolved without intervention. It has been almost 6 hours since the symptoms began. MD advise on admit overnight, cycle enzymes and d/c in am if does well. BP is very high today, she is itching and uncomfortable, plus has "white coat syndrome" chronically, which causes her BP to be up every time she goes to the MD. Give benadryl for the itching and hydralazine for BP. She has had problems with labile HTN in the past, which she believes led to her fall in 2014.  Rosaria Ferries, PA-C 10/27/2014 5:11 PM Beeper [phone]  Patient seen and examined with Rosaria Ferries, PA-C. We discussed all aspects of the encounter. I agree with the assessment and plan  as stated above.   Her symptoms are somewhat concerning to me for cardiac etiology however ECG and CE are negative. I have suggested she  stay for overnight observation and possible stress testing in am. After a long discussion with her and her daughter she is adamant that she wants to go home and says she would refuse stress testing or cath anyway. I did my best to have her consider an overnight stay ut she flatly refuses. She has agreed to come back if pain recurs.   Benay Spice 5:33 PM

## 2014-10-27 NOTE — ED Notes (Signed)
Patient ambulatory by herself.  Walked to the restroom and back to the room with appropriate gait.

## 2014-10-27 NOTE — Progress Notes (Signed)
Called to get report on pt, ED RN states pt leaving AMA.

## 2014-10-27 NOTE — ED Notes (Signed)
Spoke with ED Doctor regarding patient being discharged.

## 2014-10-27 NOTE — ED Provider Notes (Signed)
CSN: [id number]     Arrival date & time 10/27/14  1354 History   First MD Initiated Contact with Patient 10/27/14 1429     Chief Complaint  Patient presents with  . Chest Pain     (Consider location/radiation/quality/duration/timing/severity/associated sxs/prior Treatment) Patient is a 81 y.o. female presenting with chest pain. The history is provided by the patient.  Chest Pain Pain location:  Substernal area Pain quality: pressure   Pain radiates to:  Does not radiate Pain radiates to the back: no   Pain severity:  Moderate Onset quality:  Sudden Duration:  30 minutes Timing:  Constant Progression:  Resolved Chronicity:  New Context: at rest   Relieved by:  Nothing Worsened by:  Exertion Ineffective treatments:  None tried Associated symptoms: diaphoresis, lower extremity edema, palpitations and shortness of breath   Associated symptoms: no abdominal pain, no altered mental status, no cough, no dizziness, no fever, no headache, no nausea, not vomiting and no weakness   Risk factors: coronary artery disease and hypertension     Past Medical History  Diagnosis Date  . Anemia   . Arthritis   . CKD (chronic kidney disease)   . Cancer   . Fall     Syncope secondary to low BP.  Injured back.   . Hyperlipidemia   . Hypertension   . Osteoporosis   . Thyroid disease   . AAA (abdominal aortic aneurysm)   . PVD (peripheral vascular disease)    Past Surgical History  Procedure Laterality Date  . Appendectomy    . Coronary artery bypass graft      2000  . Skin cancer resected    . Breast biopsy     Family History  Problem Relation Age of Onset  . CAD Mother   . CAD Father    History  Substance Use Topics  . Smoking status: Former Research scientist (life sciences)  . Smokeless tobacco: Not on file  . Alcohol Use: No   OB History    No data available     Review of Systems  Constitutional: Positive for diaphoresis. Negative for fever and chills.  HENT: Negative for congestion,  rhinorrhea and sore throat.   Eyes: Negative for visual disturbance.  Respiratory: Positive for shortness of breath. Negative for cough.   Cardiovascular: Positive for chest pain and palpitations.  Gastrointestinal: Negative for nausea, vomiting and abdominal pain.  Genitourinary: Negative for flank pain.  Musculoskeletal: Positive for neck pain (Chronic, at baseline). Negative for neck stiffness.  Skin: Negative for rash.  Neurological: Negative for dizziness, weakness, light-headedness and headaches.      Allergies  Contrast media; Amlodipine besylate; Aspirin; Cefdinir; Cephalexin; Codeine; Doxycycline hyclate; Erythromycin; Levofloxacin; Mometasone; Peanut-containing drug products; Progesterone; Propoxyphene n-acetaminophen; and Valsartan  Home Medications   Prior to Admission medications   Medication Sig Start Date End Date Taking? Authorizing Provider  acetaminophen (TYLENOL) 500 MG tablet Take 500 mg by mouth every 6 (six) hours as needed for pain.    Historical Provider, MD  ACTONEL 35 MG tablet Take 35 mg by mouth every Saturday.  04/12/12   Historical Provider, MD  allopurinol (ZYLOPRIM) 100 MG tablet Take 200 mg by mouth daily.  05/02/12   Historical Provider, MD  atorvastatin (LIPITOR) 40 MG tablet Take 40 mg by mouth every evening.    Historical Provider, MD  bisacodyl (DULCOLAX) 10 MG suppository Place 10 mg rectally daily as needed for constipation.    Historical Provider, MD  carboxymethylcellulose (REFRESH CELLUVISC) 1 % ophthalmic solution  Apply 1 drop to eye as needed.    Historical Provider, MD  conjugated estrogens (PREMARIN) vaginal cream Place vaginally every Tuesday.    Historical Provider, MD  diphenhydrAMINE (BENADRYL) 25 MG tablet Take 25 mg by mouth as needed.    Historical Provider, MD  ezetimibe (ZETIA) 10 MG tablet Take 10 mg by mouth daily.    Historical Provider, MD  furosemide (LASIX) 40 MG tablet Take 1 tablet (40 mg total) by mouth 2 (two) times daily.  04/07/13   Donne Hazel, MD  guaiFENesin (MUCINEX) 600 MG 12 hr tablet Take 600 mg by mouth daily.    Historical Provider, MD  levothyroxine (SYNTHROID, LEVOTHROID) 75 MCG tablet Take 75 mcg by mouth daily.    Historical Provider, MD  loratadine (CLARITIN) 10 MG tablet Take 10 mg by mouth daily.    Historical Provider, MD  losartan (COZAAR) 25 MG tablet  05/11/14   Historical Provider, MD  potassium chloride SA (K-DUR,KLOR-CON) 20 MEQ tablet Take 20 mEq by mouth 4 (four) times daily.    Historical Provider, MD  psyllium (REGULOID) 0.52 G capsule Take 1.56 g by mouth daily.    Historical Provider, MD   BP 176/61 mmHg  Pulse 92  Temp(Src) 98 F (36.7 C) (Oral)  Resp 18  Ht 5\' 2"  (1.575 m)  Wt 104 lb (47.174 kg)  BMI 19.02 kg/m2  SpO2 99% Physical Exam  Constitutional: She is oriented to person, place, and time. She appears well-developed and well-nourished. No distress.  HENT:  Head: Normocephalic and atraumatic.  Mouth/Throat: No oropharyngeal exudate.  Eyes: Conjunctivae are normal. Pupils are equal, round, and reactive to light.  Neck: Normal range of motion. Neck supple.  Cardiovascular: Normal rate, regular rhythm and intact distal pulses.  Exam reveals no friction rub.   Murmur heard.  Crescendo decrescendo systolic murmur is present with a grade of 2/6  Pulmonary/Chest: Effort normal and breath sounds normal. No respiratory distress. She has no wheezes. She has no rales.  Abdominal: Soft. Bowel sounds are normal. She exhibits no distension. There is no tenderness.  Musculoskeletal: She exhibits no edema.  Neurological: She is alert and oriented to person, place, and time.  Skin: Skin is warm. No rash noted.  Nursing note and vitals reviewed.   ED Course  Procedures (including critical care time) Labs Review Labs Reviewed  CBC - Abnormal; Notable for the following:    RBC 3.03 (*)    Hemoglobin 9.9 (*)    HCT 30.8 (*)    MCV 101.7 (*)    All other components within  normal limits  BASIC METABOLIC PANEL  I-STAT TROPOININ, ED    Imaging Review No results found.   EKG Interpretation   Date/Time:  Sunday October 27 2014 13:58:54 EST Ventricular Rate:  92 PR Interval:  196 QRS Duration: 124 QT Interval:  388 QTC Calculation: 479 R Axis:   103 Text Interpretation:  Normal sinus rhythm Right bundle branch block  Abnormal ECG OLD RBBB NO SIG CHANGE Confirmed by Johnney Killian, MD, Jeannie Done  [phone]) on 10/27/2014 2:07:49 PM      MDM   Final diagnoses:  None    81 yo F with PMHx of HTN, HLD, PVD, h/o AAA, CKD, h/o MI s/p CABG who presents with acute onset chest pain, shortness of breath, diaphoresis, and general weakness while at rest. See HPI above. On arrival, T 58F, HR 92, RR 18, BP 176/61, satting 99% on RA. Exam as above, pt overall well-appearing and  in NAD. Pulses symmetric. Heart with systolic murmur, but o/w normal heart sounds. Abdomen soft, Remainder as above.  Pt's presentation is most c/f ACS, with concern for unstable angina versus NSTEMI as EKG shows no acute ST elevations. DDx is broad and includes general weakness 2/2 arrhythmia, symptomatic AS, occult infection (UTI, PNA), metabolic derangement (hyponatremia, hypo/hyperkalemia, acidosis, hypoglycemia), CHF, hypothyroidism. Will send broad labs. Pt is allergic to ASA but has taken Plavix this AM. Pt has seen Cardiology, will plan to consult for further management and likely admission.  VSS. CBC with no leukocytosis. Hgb 9.9 at baseline. BMP with baseline CKD, BUN 66, CKD 1.75. BNP 198. iStat troponin negative at 0.03. TSH WNL. Mag WNL. CXR clear. Cardiology has evaluated and will admit for ACS work-up and rule-out.  Upon discussion with Cardiology, pt now demanding to leave AMA. She states she would not desire any intervention if her troponin was to be positive, especially no cath. I discussed alternative options such as heparin anticoagulation, medical management, other non-surgical or  non-interventional management options and patient still demanding to leave AMA. She understands the risks and alternatives of doing so, including worsening ACS with subsequent major heart attack, arrhythmia, or other acute emergency resulting in severe disability and/or death. Prior to this discussion, I determined that the patient had full decision making capacity and understood the alternatives and consequences of her decision. I encouraged her daughter to discuss with her but patient but still demanding to leave. All questions were answered and all attempts were made to treat the patient. She was encouraged to return if she should change her mind or seek further treatment. Patient expressed understanding.  Clinical Impression: 1. Chest pain, unspecified chest pain type   2. Diaphoresis   3. CKD (chronic kidney disease) stage 4, GFR 15-29 ml/min   4. Anemia, unspecified anemia type   5. Elevated brain natriuretic peptide (BNP) level     Disposition: Discharge  Condition: Good  I have discussed the results, Dx and Tx plan with the pt(& family if present). He/she/they expressed understanding and agree(s) with the plan. Discharge instructions discussed at great length. Strict return precautions discussed and pt &/or family have verbalized understanding of the instructions. No further questions at time of discharge.    Discharge Medication List as of 10/27/2014  7:02 PM      Follow Up: Nicoletta Dress, MD [place] [address] [phone]   Follow-up tomorrow for repeat labs and check up  [place] [address] [id number] [place] [address] [phone]  As needed, If symptoms worsen, or if you decide to pursue admission   Pt seen in conjunction with Dr. Cleone Slim, MD 10/28/14 [place] III, MD 10/28/14 [phone]

## 2014-10-27 NOTE — ED Notes (Signed)
Cardiology PA at bedside. 

## 2014-10-27 NOTE — ED Notes (Signed)
MD at bedside with patient

## 2014-10-31 ENCOUNTER — Encounter: Payer: Self-pay | Admitting: Cardiology

## 2015-02-24 ENCOUNTER — Other Ambulatory Visit: Payer: Self-pay

## 2015-05-03 ENCOUNTER — Encounter (HOSPITAL_COMMUNITY): Payer: Self-pay | Admitting: *Deleted

## 2015-05-03 ENCOUNTER — Inpatient Hospital Stay (HOSPITAL_COMMUNITY)
Admission: AD | Admit: 2015-05-03 | Discharge: 2015-05-05 | DRG: 378 | Disposition: A | Discharge status: Home / Self Care | Payer: Medicare Other | Source: Other Acute Inpatient Hospital | Attending: Internal Medicine | Admitting: Internal Medicine

## 2015-05-03 DIAGNOSIS — M1990 Unspecified osteoarthritis, unspecified site: Secondary | ICD-10-CM | POA: Diagnosis present

## 2015-05-03 DIAGNOSIS — Z888 Allergy status to other drugs, medicaments and biological substances status: Secondary | ICD-10-CM | POA: Diagnosis not present

## 2015-05-03 DIAGNOSIS — I739 Peripheral vascular disease, unspecified: Secondary | ICD-10-CM | POA: Diagnosis present

## 2015-05-03 DIAGNOSIS — I10 Essential (primary) hypertension: Secondary | ICD-10-CM | POA: Diagnosis present

## 2015-05-03 DIAGNOSIS — Z951 Presence of aortocoronary bypass graft: Secondary | ICD-10-CM | POA: Diagnosis not present

## 2015-05-03 DIAGNOSIS — I714 Abdominal aortic aneurysm, without rupture: Secondary | ICD-10-CM | POA: Diagnosis present

## 2015-05-03 DIAGNOSIS — Z881 Allergy status to other antibiotic agents status: Secondary | ICD-10-CM

## 2015-05-03 DIAGNOSIS — E039 Hypothyroidism, unspecified: Secondary | ICD-10-CM | POA: Diagnosis present

## 2015-05-03 DIAGNOSIS — N184 Chronic kidney disease, stage 4 (severe): Secondary | ICD-10-CM | POA: Diagnosis present

## 2015-05-03 DIAGNOSIS — K222 Esophageal obstruction: Secondary | ICD-10-CM | POA: Diagnosis present

## 2015-05-03 DIAGNOSIS — Z7902 Long term (current) use of antithrombotics/antiplatelets: Secondary | ICD-10-CM | POA: Diagnosis not present

## 2015-05-03 DIAGNOSIS — Z85828 Personal history of other malignant neoplasm of skin: Secondary | ICD-10-CM | POA: Diagnosis not present

## 2015-05-03 DIAGNOSIS — D631 Anemia in chronic kidney disease: Secondary | ICD-10-CM | POA: Diagnosis present

## 2015-05-03 DIAGNOSIS — Z91010 Allergy to peanuts: Secondary | ICD-10-CM

## 2015-05-03 DIAGNOSIS — M810 Age-related osteoporosis without current pathological fracture: Secondary | ICD-10-CM | POA: Diagnosis present

## 2015-05-03 DIAGNOSIS — I2510 Atherosclerotic heart disease of native coronary artery without angina pectoris: Secondary | ICD-10-CM | POA: Diagnosis present

## 2015-05-03 DIAGNOSIS — K921 Melena: Secondary | ICD-10-CM | POA: Diagnosis not present

## 2015-05-03 DIAGNOSIS — N183 Chronic kidney disease, stage 3 unspecified: Secondary | ICD-10-CM | POA: Diagnosis present

## 2015-05-03 DIAGNOSIS — Z885 Allergy status to narcotic agent status: Secondary | ICD-10-CM

## 2015-05-03 DIAGNOSIS — Z91041 Radiographic dye allergy status: Secondary | ICD-10-CM

## 2015-05-03 DIAGNOSIS — G8929 Other chronic pain: Secondary | ICD-10-CM | POA: Diagnosis present

## 2015-05-03 DIAGNOSIS — D62 Acute posthemorrhagic anemia: Secondary | ICD-10-CM | POA: Diagnosis present

## 2015-05-03 DIAGNOSIS — N189 Chronic kidney disease, unspecified: Secondary | ICD-10-CM | POA: Diagnosis not present

## 2015-05-03 DIAGNOSIS — E785 Hyperlipidemia, unspecified: Secondary | ICD-10-CM | POA: Diagnosis present

## 2015-05-03 DIAGNOSIS — Z79899 Other long term (current) drug therapy: Secondary | ICD-10-CM

## 2015-05-03 DIAGNOSIS — Z955 Presence of coronary angioplasty implant and graft: Secondary | ICD-10-CM

## 2015-05-03 DIAGNOSIS — M542 Cervicalgia: Secondary | ICD-10-CM | POA: Diagnosis present

## 2015-05-03 DIAGNOSIS — Z91018 Allergy to other foods: Secondary | ICD-10-CM | POA: Diagnosis not present

## 2015-05-03 DIAGNOSIS — Z8249 Family history of ischemic heart disease and other diseases of the circulatory system: Secondary | ICD-10-CM | POA: Diagnosis not present

## 2015-05-03 DIAGNOSIS — Z91048 Other nonmedicinal substance allergy status: Secondary | ICD-10-CM

## 2015-05-03 DIAGNOSIS — I129 Hypertensive chronic kidney disease with stage 1 through stage 4 chronic kidney disease, or unspecified chronic kidney disease: Secondary | ICD-10-CM | POA: Diagnosis present

## 2015-05-03 DIAGNOSIS — Z87891 Personal history of nicotine dependence: Secondary | ICD-10-CM | POA: Diagnosis not present

## 2015-05-03 DIAGNOSIS — K922 Gastrointestinal hemorrhage, unspecified: Secondary | ICD-10-CM | POA: Diagnosis present

## 2015-05-03 LAB — BASIC METABOLIC PANEL
ANION GAP: 11 (ref 5–15)
BUN: 61 mg/dL — AB (ref 6–20)
CHLORIDE: 108 mmol/L (ref 101–111)
CO2: 20 mmol/L — ABNORMAL LOW (ref 22–32)
Calcium: 9.1 mg/dL (ref 8.9–10.3)
Creatinine, Ser: 1.69 mg/dL — ABNORMAL HIGH (ref 0.44–1.00)
GFR calc Af Amer: 30 mL/min — ABNORMAL LOW (ref >60)
GFR, EST NON AFRICAN AMERICAN: 26 mL/min — AB (ref >60)
GLUCOSE: 104 mg/dL — AB (ref 65–99)
POTASSIUM: 4.1 mmol/L (ref 3.5–5.1)
Sodium: 139 mmol/L (ref 135–145)

## 2015-05-03 LAB — CBC
HEMATOCRIT: 35 % — AB (ref 36.0–46.0)
HEMOGLOBIN: 11.5 g/dL — AB (ref 12.0–15.0)
MCH: 30.4 pg (ref 26.0–34.0)
MCHC: 32.9 g/dL (ref 30.0–36.0)
MCV: 92.6 fL (ref 78.0–100.0)
Platelets: 201 10*3/uL (ref 150–400)
RBC: 3.78 MIL/uL — ABNORMAL LOW (ref 3.87–5.11)
RDW: 18 % — AB (ref 11.5–15.5)
WBC: 10.7 10*3/uL — AB (ref 4.0–10.5)

## 2015-05-03 LAB — PROTIME-INR
INR: 1.17 (ref 0.00–1.49)
PROTHROMBIN TIME: 15.1 s (ref 11.6–15.2)

## 2015-05-03 LAB — MRSA PCR SCREENING: MRSA BY PCR: NEGATIVE

## 2015-05-03 LAB — TSH: TSH: 1.773 u[IU]/mL (ref 0.350–4.500)

## 2015-05-03 MED ORDER — SODIUM CHLORIDE 0.9 % IJ SOLN: 3.0000 mL | Freq: Two times a day (BID) | INTRAMUSCULAR | Status: DC

## 2015-05-03 MED ORDER — ONDANSETRON HCL 4 MG PO TABS: 4.0000 mg | ORAL_TABLET | Freq: Four times a day (QID) | ORAL | Status: DC | PRN

## 2015-05-03 MED ORDER — ACETAMINOPHEN 650 MG RE SUPP: 650.0000 mg | Freq: Four times a day (QID) | RECTAL | Status: DC | PRN

## 2015-05-03 MED ORDER — LEVOTHYROXINE SODIUM 50 MCG PO TABS
75.0000 ug | ORAL_TABLET | Freq: Every day | ORAL | Status: DC
  Administered 2015-05-04 – 2015-05-05 (×3): 75 ug via ORAL
  Filled 2015-05-03 (×4): qty 1

## 2015-05-03 MED ORDER — HYDRALAZINE HCL 20 MG/ML IJ SOLN
10.0000 mg | Freq: Four times a day (QID) | INTRAMUSCULAR | Status: DC | PRN
  Filled 2015-05-03: qty 1

## 2015-05-03 MED ORDER — PANTOPRAZOLE SODIUM 40 MG IV SOLR
40.0000 mg | Freq: Two times a day (BID) | INTRAVENOUS | Status: DC
  Administered 2015-05-03: 40 mg via INTRAVENOUS
  Filled 2015-05-03: qty 40

## 2015-05-03 MED ORDER — ONDANSETRON HCL 4 MG/2ML IJ SOLN: 4.0000 mg | Freq: Four times a day (QID) | INTRAMUSCULAR | Status: DC | PRN

## 2015-05-03 MED ORDER — SODIUM CHLORIDE 0.9 % IV SOLN
INTRAVENOUS | Status: DC
  Administered 2015-05-03: 21:00:00 via INTRAVENOUS
  Administered 2015-05-04: 75 mL/h via INTRAVENOUS
  Administered 2015-05-05: 07:00:00 via INTRAVENOUS

## 2015-05-03 MED ORDER — ACETAMINOPHEN 325 MG PO TABS
650.0000 mg | ORAL_TABLET | Freq: Four times a day (QID) | ORAL | Status: DC | PRN
  Administered 2015-05-03: 650 mg via ORAL
  Filled 2015-05-03: qty 2

## 2015-05-03 MED ORDER — MORPHINE SULFATE (PF) 2 MG/ML IV SOLN: 1.0000 mg | INTRAVENOUS | Status: DC | PRN

## 2015-05-03 MED ORDER — ALLOPURINOL 100 MG PO TABS
200.0000 mg | ORAL_TABLET | Freq: Every day | ORAL | Status: DC
  Administered 2015-05-04 – 2015-05-05 (×2): 200 mg via ORAL
  Filled 2015-05-03 (×2): qty 2

## 2015-05-03 NOTE — Progress Notes (Signed)
Blood stopped at [place]. No s/s of reaction.

## 2015-05-03 NOTE — H&P (Signed)
[facility] History and Physical  [name] OXB:[id number] DOB: [birth year] DOA: 05/03/2015  Referring physician: EDP and Oval Linsey PCP: Nicoletta Dress, MD   Chief Complaint: Melanotic stools  HPI: [name] is a 81 y.o. female with past medical history of CKD stage 3-4, hypertension, AAA and CAD on Plavix. Patient came in to the hospital complaining about melanotic stools. Patient started to have tarry black and sticky stools 4 days prior to admission, average 1-2 bowel movements per day, denies any abdominal pain, patient also started to have generalized weakness and decline in her functional status the same time. She saw her primary care physician 1 day PTA and the hemoglobin came back at 7.4 so he sent her to [facility] for further eval. In [facility] ED hemoglobin is 7.8, she was sent to [facility] because lack of GI coverage in [facility] over the weekend. Vital signs are stable.  Review of Systems:  Constitutional: negative for anorexia, fevers and sweats Eyes: negative for irritation, redness and visual disturbance Ears, nose, mouth, throat, and face: negative for earaches, epistaxis, nasal congestion and sore throat Respiratory: negative for cough, dyspnea on exertion, sputum and wheezing Cardiovascular: negative for chest pain, dyspnea, lower extremity edema, orthopnea, palpitations and syncope Gastrointestinal: Melanotic stools, denies any abdominal pain, diarrhea or nausea or vomiting. Genitourinary:negative for dysuria, frequency and hematuria Hematologic/lymphatic: negative for bleeding, easy bruising and lymphadenopathy Musculoskeletal:negative for arthralgias, muscle weakness and stiff joints Neurological: negative for coordination problems, gait problems, headaches and weakness Endocrine: negative for diabetic symptoms including polydipsia, polyuria and weight loss Allergic/Immunologic: negative for anaphylaxis, hay fever and  urticaria  Past Medical History  Diagnosis Date  . Anemia   . Arthritis   . CKD (chronic kidney disease)   . Cancer   . Fall     Syncope secondary to low BP.  Injured back.   . Hyperlipidemia   . Hypertension   . Osteoporosis   . Hypothyroid   . AAA (abdominal aortic aneurysm)   . PVD (peripheral vascular disease)    Past Surgical History  Procedure Laterality Date  . Appendectomy    . Coronary artery bypass graft      2000  . Skin cancer resected    . Breast biopsy    . Coronary angioplasty with stent placement  2004    BMS x 2 to LIMA-LAD   Social History:   reports that she has quit smoking. She does not have any smokeless tobacco history on file. She reports that she does not drink alcohol or use illicit drugs.  Allergies  Allergen Reactions  . Contrast Media [Iodinated Diagnostic Agents] Itching  . Amlodipine Besylate   . Aspirin Swelling  . Cefdinir Nausea And Vomiting  . Cephalexin Nausea And Vomiting  . Codeine Nausea And Vomiting and Other (See Comments)    dizziness  . Doxycycline Hyclate Nausea And Vomiting  . Erythromycin Nausea And Vomiting  . Levofloxacin Nausea And Vomiting  . Mometasone   . Peanut-Containing Drug Products Nausea And Vomiting  . Progesterone   . Propoxyphene N-Acetaminophen   . Valsartan     Family History  Problem Relation Age of Onset  . CAD Mother   . CAD Father     Prior to Admission medications   Medication Sig Start Date End Date Taking? Authorizing Provider  acetaminophen (TYLENOL) 500 MG tablet Take 500 mg by mouth every 6 (six) hours as needed for pain.    Historical Provider, MD  ACTONEL 35 MG  tablet Take 35 mg by mouth every Saturday.  04/12/12   Historical Provider, MD  allopurinol (ZYLOPRIM) 100 MG tablet Take 200 mg by mouth daily.  05/02/12   Historical Provider, MD  atorvastatin (LIPITOR) 40 MG tablet Take 40 mg by mouth every evening.    Historical Provider, MD  bisacodyl (DULCOLAX) 10 MG suppository Place 10  mg rectally daily as needed for constipation.    Historical Provider, MD  carboxymethylcellulose (REFRESH CELLUVISC) 1 % ophthalmic solution Apply 1 drop to eye as needed (dry eyes, red eyes).     Historical Provider, MD  clopidogrel (PLAVIX) 75 MG tablet Take 75 mg by mouth daily before breakfast. 10/01/14   Historical Provider, MD  conjugated estrogens (PREMARIN) vaginal cream Place vaginally every Tuesday.    Historical Provider, MD  diphenhydrAMINE (BENADRYL) 25 MG tablet Take 25 mg by mouth as needed for itching or allergies.     Historical Provider, MD  ezetimibe (ZETIA) 10 MG tablet Take 10 mg by mouth daily.    Historical Provider, MD  furosemide (LASIX) 40 MG tablet Take 1 tablet (40 mg total) by mouth 2 (two) times daily. 04/07/13   Donne Hazel, MD  guaiFENesin (MUCINEX) 600 MG 12 hr tablet Take 600 mg by mouth daily.    Historical Provider, MD  levothyroxine (SYNTHROID, LEVOTHROID) 75 MCG tablet Take 75 mcg by mouth daily.    Historical Provider, MD  loratadine (CLARITIN) 10 MG tablet Take 10 mg by mouth daily.    Historical Provider, MD  losartan (COZAAR) 25 MG tablet Take 50 mg by mouth daily.  05/11/14   Historical Provider, MD  psyllium (REGULOID) 0.52 G capsule Take 1.56 g by mouth daily.    Historical Provider, MD   Physical Exam: Filed Vitals:   05/03/15 1745  BP: 182/62  Pulse: 92  Temp: 98.1 F (36.7 C)  Resp: 19   Constitutional: Oriented to person, place, and time. Well-developed and well-nourished. Cooperative.  Head: Normocephalic and atraumatic.  Nose: Nose normal.  Mouth/Throat: Uvula is midline, oropharynx is clear and moist and mucous membranes are normal.  Eyes: Conjunctivae and EOM are normal. Pupils are equal, round, and reactive to light.  Neck: Trachea normal and normal range of motion. Neck supple.  Cardiovascular: Normal rate, regular rhythm, S1 normal, S2 normal, normal heart sounds and intact distal pulses.   Pulmonary/Chest: Effort normal and breath  sounds normal.  Abdominal: Soft. Bowel sounds are normal. There is no hepatosplenomegaly. There is no tenderness.  Musculoskeletal: Normal range of motion.  Neurological: Alert and oriented to person, place, and time. Has normal strength. No cranial nerve deficit or sensory deficit.  Skin: Skin is warm, dry and intact.  Psychiatric: Has a normal mood and affect. Speech is normal and behavior is normal.   Labs on Admission:  Basic Metabolic Panel: From [facility] ED on 05/03/15 Sodium 142, potassium 4.5, chloride 106, bicarbonate 23, glucose 105, BUN 75, creatinine 2.0 and calcium 9.1 Liver Function Tests: Albumin 4.0, total protein 7.6, AST 47, ALT 28, alkaline phosphatase 107. CBC: WBC 9.0, hemoglobin 7.8, hematocrit 23.7, platelets 191 Cardiac Enzymes: No results for input(s): CKTOTAL, CKMB, CKMBINDEX, TROPONINI in the last 168 hours.  BNP (last 3 results)  Recent Labs  10/27/14 1511  BNP 198.3*    ProBNP (last 3 results) No results for input(s): PROBNP in the last 8760 hours.  CBG: No results for input(s): GLUCAP in the last 168 hours.  Radiological Exams on Admission: No results found.  EKG:  Independently reviewed.   Assessment/Plan Principal Problem:   Melanotic stools Active Problems:   Hypothyroidism   Essential hypertension   CKD (chronic kidney disease), stage III   Acute blood loss anemia   Anemia in chronic renal disease    Melanotic stools Presented with 3 day melanotic stools, average 1-2 BMs/day, last bowel movement 2 days PTA. Per report baseline hemoglobin about 9.9, patient presented with hemoglobin of 7.8 in [place] EGD. Transfer to [facility] because there is no GI coverage in [facility] in the weekend. Started on Protonix 40 mg IV twice a day. Visual been clear liquids, nothing by mouth after midnight. Consulted Dr. Hilarie Fredrickson to evaluate the patient in a.m.  Acute blood loss anemia Patient has acute blood loss anemia on top of anemia of  chronic disease. Patient appears to have chronic anemia secondary to CKD stage 3-4. Check hemoglobin every 12 hours, transfuse if hemoglobin less than 8.0, patient has history of CAD. Patient already had 2 units of packed RBCs, hemoglobin after the transfusion.  Essential hypertension Hold antihypertensives medications, because of concurrent bleed.  Hypothyroidism Check TSH, continue Synthroid dose.  Stage III-IV CKD Patient has stage III to stage IV CKD, per Dr. Elissa Hefty notes creatinine was 1.7 back in March 2016. Patient creatinine 2.0 according to [facility] EGD notes Patient started on IV fluids, check BMP in a.m.   Code Status: Full code Family Communication: Plan discussed with the patient in the presence of her daughter at bedside.  Disposition Plan: stepdown, inpatient   Time spent: 70 minutes  Santhiago Collingsworth A, MD [facility] Pager [phone]

## 2015-05-03 NOTE — Progress Notes (Signed)
Received patient from [facility] via [facility] with 2nd unit of PRBC infusing.

## 2015-05-04 ENCOUNTER — Encounter (HOSPITAL_COMMUNITY): Payer: Self-pay | Admitting: Internal Medicine

## 2015-05-04 ENCOUNTER — Encounter (HOSPITAL_COMMUNITY)
Admission: AD | Disposition: A | Discharge status: Home / Self Care | Payer: Self-pay | Source: Other Acute Inpatient Hospital | Attending: Internal Medicine

## 2015-05-04 DIAGNOSIS — K922 Gastrointestinal hemorrhage, unspecified: Secondary | ICD-10-CM | POA: Diagnosis present

## 2015-05-04 DIAGNOSIS — K921 Melena: Secondary | ICD-10-CM | POA: Diagnosis present

## 2015-05-04 DIAGNOSIS — D62 Acute posthemorrhagic anemia: Secondary | ICD-10-CM

## 2015-05-04 HISTORY — PX: ESOPHAGOGASTRODUODENOSCOPY: SHX5428

## 2015-05-04 LAB — CBC
HCT: 31.7 % — ABNORMAL LOW (ref 36.0–46.0)
Hemoglobin: 10.2 g/dL — ABNORMAL LOW (ref 12.0–15.0)
MCH: 29.9 pg (ref 26.0–34.0)
MCHC: 32.2 g/dL (ref 30.0–36.0)
MCV: 93 fL (ref 78.0–100.0)
PLATELETS: 185 10*3/uL (ref 150–400)
RBC: 3.41 MIL/uL — AB (ref 3.87–5.11)
RDW: 18.8 % — AB (ref 11.5–15.5)
WBC: 8.7 10*3/uL (ref 4.0–10.5)

## 2015-05-04 LAB — BASIC METABOLIC PANEL
Anion gap: 9 (ref 5–15)
BUN: 55 mg/dL — ABNORMAL HIGH (ref 6–20)
CALCIUM: 8.8 mg/dL — AB (ref 8.9–10.3)
CO2: 20 mmol/L — AB (ref 22–32)
CREATININE: 1.64 mg/dL — AB (ref 0.44–1.00)
Chloride: 113 mmol/L — ABNORMAL HIGH (ref 101–111)
GFR calc non Af Amer: 27 mL/min — ABNORMAL LOW (ref >60)
GFR, EST AFRICAN AMERICAN: 31 mL/min — AB (ref >60)
GLUCOSE: 72 mg/dL (ref 65–99)
Potassium: 4.1 mmol/L (ref 3.5–5.1)
Sodium: 142 mmol/L (ref 135–145)

## 2015-05-04 SURGERY — EGD (ESOPHAGOGASTRODUODENOSCOPY)
Anesthesia: Moderate Sedation

## 2015-05-04 MED ORDER — MIDAZOLAM HCL 5 MG/ML IJ SOLN
INTRAMUSCULAR | Status: AC
  Filled 2015-05-04: qty 2

## 2015-05-04 MED ORDER — FAMOTIDINE 10 MG PO TABS
10.0000 mg | ORAL_TABLET | Freq: Two times a day (BID) | ORAL | Status: DC | PRN
  Filled 2015-05-04: qty 1

## 2015-05-04 MED ORDER — SODIUM CHLORIDE 0.9 % IV SOLN: INTRAVENOUS | Status: DC

## 2015-05-04 MED ORDER — FENTANYL CITRATE (PF) 100 MCG/2ML IJ SOLN
INTRAMUSCULAR | Status: AC
  Filled 2015-05-04: qty 2

## 2015-05-04 MED ORDER — FENTANYL CITRATE (PF) 100 MCG/2ML IJ SOLN
INTRAMUSCULAR | Status: DC | PRN
  Administered 2015-05-04: 12.5 ug via INTRAVENOUS

## 2015-05-04 MED ORDER — MIDAZOLAM HCL 10 MG/2ML IJ SOLN
INTRAMUSCULAR | Status: DC | PRN
  Administered 2015-05-04 (×2): 1 mg via INTRAVENOUS

## 2015-05-04 NOTE — Op Note (Signed)
[facility] [place], [id number]   ENDOSCOPY PROCEDURE REPORT  PATIENT: [name], [name]  MR#: [id number] BIRTHDATE: 31-Oct-1923 , 89  yrs. old GENDER: female ENDOSCOPIST: Jerene Bears, MD REFERRED BY:  [facility] PROCEDURE DATE:  05/04/2015 PROCEDURE:  EGD, diagnostic ASA CLASS:     Class III INDICATIONS:  melena and acute post hemorrhagic anemia. MEDICATIONS: Fentanyl 12.5 mcg IV, Versed 2 mg IV TOPICAL ANESTHETIC: Cetacaine Spray  DESCRIPTION OF PROCEDURE: After the risks benefits and alternatives of the procedure were thoroughly explained, informed consent was obtained.  The PENTAX GASTROSCOPE [id number] endoscope was introduced through the mouth and advanced to the second portion of the duodenum , Without limitations.  The instrument was slowly withdrawn as the mucosa was fully examined.  ESOPHAGUS: The mucosa of the esophagus appeared normal.   A partial Schatzki ring was found at the gastroesophageal junction and was widely open.  STOMACH: The mucosa of the stomach appeared normal.  DUODENUM: The duodenal mucosa showed no abnormalities in the bulb and 2nd part of the duodenum.  Retroflexed views revealed no abnormalities.     The scope was then withdrawn from the patient and the procedure completed.  COMPLICATIONS: There were no immediate complications.  ENDOSCOPIC IMPRESSION: 1.   The mucosa of the esophagus appeared normal 2.   Partial and widely patent Schatzki's ring was found at the gastroesophageal junction 3.   The mucosa of the stomach appeared normal 4.   The duodenal mucosa showed no abnormalities in the bulb and 2nd part of the duodenum 5.  No source for or evidence of active or recent bleeding in the upper GI tract  RECOMMENDATIONS: 1.  Monitor Hgb 2.  Discussion with cardiology regarding the risks/benefits of continued clopidogrel use 3.  Colonoscopy would be the next diagnostic step, but patient  does not want to proceed with this test  eSigned:  Jerene Bears, MD 05/04/2015 9:06 AM    CC: the patient  PATIENT NAME:  [name], [name] MR#: [id number]

## 2015-05-04 NOTE — Consult Note (Signed)
Consultation  Referring Provider:  Dr. Hartford Poli, [facility] Primary Care Physician:  Nicoletta Dress, MD Primary Gastroenterologist:  Dr. Lyda Jester, [place]  Reason for Consultation:  Melena, acute on chronic anemia  HPI: [name] is a 81 y.o. female with past medical history of CAD status post PCI and open heart surgery after coronary artery injury associated with catheterization, chronic Plavix use, AAA, peripheral vascular disease, hypertension, hyperlipidemia, arthritis and chronic kidney disease who is transferred to [facility] from [facility] where she presented with melena 4 days and was found to have worsening anemia with hemoglobin 7.4. She was given 2 units of red blood cells during transfer.  She reports that for the past 4 days she has noticed black and tarry stools. Denies diarrhea was having approximately 1 or 2 bowel movements per day. Was not having significant abdominal pain though she does note if she eats greasy foods she has upper abdominal discomfort. She denies nausea or vomiting. Denies heartburn, dysphagia or odynophagia. She does endorse constipation and often uses stool softeners and fiber supplementation. She denies rectal bleeding or hematochezia. She reports her weight is been stable. She uses Tylenol for pain. She is allergic to aspirin and denies using NSAIDs. She has chronic cervical neck pain after prior fall with cervical spine injury. She does endorse fatigue but this is been an ongoing issue over 2 years. She denies recent chest pain or dyspnea. She lives with her 14 year old husband who she says is quite debilitated with COPD. She has been doing the driving over the last 2 months. She is functional in her home but does have someone come in daily to help with some activities of daily living.  She has had previous colonoscopies 2 with Dr. Lyda Jester in [place]. She reports these have been normal and she was told "you will never need  another one of these". She denies a history of polyps. No prior upper endoscopy.   Past Medical History  Diagnosis Date  . Anemia   . Arthritis   . CKD (chronic kidney disease)   . Cancer   . Fall     Syncope secondary to low BP.  Injured back.   . Hyperlipidemia   . Hypertension   . Osteoporosis   . Hypothyroid   . AAA (abdominal aortic aneurysm)   . PVD (peripheral vascular disease)     Past Surgical History  Procedure Laterality Date  . Appendectomy    . Coronary artery bypass graft      2000  . Skin cancer resected    . Breast biopsy    . Coronary angioplasty with stent placement  2004    BMS x 2 to [facility]    Prior to Admission medications   Medication Sig Start Date End Date Taking? Authorizing Provider  acetaminophen (TYLENOL) 500 MG tablet Take 500 mg by mouth every 6 (six) hours as needed for pain.    Historical Provider, MD  ACTONEL 35 MG tablet Take 35 mg by mouth every Saturday.  04/12/12   Historical Provider, MD  allopurinol (ZYLOPRIM) 100 MG tablet Take 200 mg by mouth daily.  05/02/12   Historical Provider, MD  atorvastatin (LIPITOR) 40 MG tablet Take 40 mg by mouth every evening.    Historical Provider, MD  bisacodyl (DULCOLAX) 10 MG suppository Place 10 mg rectally daily as needed for constipation.    Historical Provider, MD  carboxymethylcellulose (REFRESH CELLUVISC) 1 % ophthalmic solution Apply 1 drop to eye as needed (dry eyes,  red eyes).     Historical Provider, MD  clopidogrel (PLAVIX) 75 MG tablet Take 75 mg by mouth daily before breakfast. 10/01/14   Historical Provider, MD  conjugated estrogens (PREMARIN) vaginal cream Place vaginally every Tuesday.    Historical Provider, MD  diphenhydrAMINE (BENADRYL) 25 MG tablet Take 25 mg by mouth as needed for itching or allergies.     Historical Provider, MD  ezetimibe (ZETIA) 10 MG tablet Take 10 mg by mouth daily.    Historical Provider, MD  furosemide (LASIX) 40 MG tablet Take 1 tablet (40 mg total) by mouth  2 (two) times daily. 04/07/13   Donne Hazel, MD  guaiFENesin (MUCINEX) 600 MG 12 hr tablet Take 600 mg by mouth daily.    Historical Provider, MD  levothyroxine (SYNTHROID, LEVOTHROID) 75 MCG tablet Take 75 mcg by mouth daily.    Historical Provider, MD  loratadine (CLARITIN) 10 MG tablet Take 10 mg by mouth daily.    Historical Provider, MD  losartan (COZAAR) 25 MG tablet Take 50 mg by mouth daily.  05/11/14   Historical Provider, MD  psyllium (REGULOID) 0.52 G capsule Take 1.56 g by mouth daily.    Historical Provider, MD    Current Facility-Administered Medications  Medication Dose Route Frequency Provider Last Rate Last Dose  . 0.9 %  sodium chloride infusion   Intravenous Continuous Verlee Monte, MD 75 mL/hr at 05/03/15 2031    . acetaminophen (TYLENOL) tablet 650 mg  650 mg Oral Q6H PRN Verlee Monte, MD   650 mg at 05/03/15 2115   Or  . acetaminophen (TYLENOL) suppository 650 mg  650 mg Rectal Q6H PRN Verlee Monte, MD      . allopurinol (ZYLOPRIM) tablet 200 mg  200 mg Oral Daily Verlee Monte, MD      . hydrALAZINE (APRESOLINE) injection 10 mg  10 mg Intravenous Q6H PRN Verlee Monte, MD      . levothyroxine (SYNTHROID, LEVOTHROID) tablet 75 mcg  75 mcg Oral QAC breakfast Verlee Monte, MD   75 mcg at 05/04/15 0601  . morphine 2 MG/ML injection 1 mg  1 mg Intravenous Q4H PRN Verlee Monte, MD      . ondansetron (ZOFRAN) tablet 4 mg  4 mg Oral Q6H PRN Verlee Monte, MD       Or  . ondansetron (ZOFRAN) injection 4 mg  4 mg Intravenous Q6H PRN Verlee Monte, MD      . pantoprazole (PROTONIX) injection 40 mg  40 mg Intravenous Q12H Verlee Monte, MD   40 mg at 05/03/15 2115  . sodium chloride 0.9 % injection 3 mL  3 mL Intravenous Q12H Verlee Monte, MD   3 mL at 05/03/15 2200    Allergies as of 05/03/2015 - Review Complete 05/03/2015  Allergen Reaction Noted  . Contrast media [iodinated diagnostic agents] Itching 04/06/2013  . Amlodipine besylate  04/22/2009  . Aspirin Swelling 04/18/2009    . Cefdinir Nausea And Vomiting 04/18/2009  . Cephalexin Nausea And Vomiting 04/18/2009  . Codeine Nausea And Vomiting and Other (See Comments) 04/18/2009  . Doxycycline hyclate Nausea And Vomiting 04/18/2009  . Erythromycin Nausea And Vomiting 04/18/2009  . Levofloxacin Nausea And Vomiting 04/18/2009  . Mometasone  04/18/2009  . Peanut-containing drug products Nausea And Vomiting 04/18/2009  . Progesterone  04/18/2009  . Propoxyphene n-acetaminophen  04/18/2009  . Valsartan  04/22/2009    Family History  Problem Relation Age of Onset  . CAD Mother   . CAD Father  Social History   Social History  . Marital Status: Married    Spouse Name: N/A  . Number of Children: 1  . Years of Education: N/A   Occupational History  . Retired    Social History Main Topics  . Smoking status: Former Research scientist (life sciences)  . Smokeless tobacco: Not on file  . Alcohol Use: No  . Drug Use: No  . Sexual Activity: No   Other Topics Concern  . Not on file   Social History Narrative   Lives with husband    Review of Systems: As per history of present illness, otherwise negative  Physical Exam: Vital signs in last 24 hours: Temp:  [98 F (36.7 C)-98.7 F (37.1 C)] 98.1 F (36.7 C) (09/04 0400) Pulse Rate:  [79-104] 79 (09/04 0400) Resp:  [17-24] 17 (09/04 0400) BP: (116-182)/(36-68) 127/36 mmHg (09/04 0400) SpO2:  [96 %-100 %] 96 % (09/04 0400) Weight:  [107 lb 5.8 oz (48.7 kg)] 107 lb 5.8 oz (48.7 kg) (09/03 1709) Last BM Date: 05/03/15 General:   Alert,  Well-developed elderly female, well-nourished, pleasant and cooperative in NAD Head:  Normocephalic and atraumatic. Eyes:  Sclera clear, no icterus.   Conjunctiva pink. Nose:  Prior surgical scar  Mouth:  No deformity or lesions.   Neck:  Supple; no masses  Lungs:  Clear throughout to auscultation.   No wheezes, crackles, or rhonchi.  Heart:  Regular rate and rhythm; 3/6 SEM Abdomen:  Soft,nontender, nondistended, BS active  Rectal:   Deferred  Msk:  Symmetrical without gross deformities.  Extremities:  Without clubbing or edema. Neurologic:  Alert and  oriented x4;  grossly normal neurologically. Psych:  Alert and cooperative. Normal mood and affect.  Lab Results:  Recent Labs  05/03/15 2028 05/04/15 0557  WBC 10.7* 8.7  HGB 11.5* 10.2*  HCT 35.0* 31.7*  PLT 201 185   BMET  Recent Labs  05/03/15 2028 05/04/15 0557  NA 139 142  K 4.1 4.1  CL 108 113*  CO2 20* 20*  GLUCOSE 104* 72  BUN 61* 55*  CREATININE 1.69* 1.64*  CALCIUM 9.1 8.8*   PT/INR  Recent Labs  05/03/15 2030  LABPROT 15.1  INR 1.17     IMPRESSION:  81 y.o. female with past medical history of CAD status post PCI and open heart surgery after coronary artery injury associated with catheterization, chronic Plavix use, AAA, peripheral vascular disease, hypertension, hyperlipidemia, arthritis and chronic kidney disease who is transferred to [facility] from [facility] where she presented with melena 4 days and was found to have worsening anemia with hemoglobin 7.4.  PLAN: 1. Melena/acute anemia -- concern for upper GI bleeding source. I have recommended endoscopy for further evaluation. She has been on Plavix which is now been held, last dose was 05/02/2015. She is inappropriately placed on twice daily PPI. Hemoglobin responded appropriately to 2 units of packed red blood cells. No evidence for active bleeding at present. Vital signs have been stable throughout the night.  She is appropriately concerned about procedures. We had a thorough discussion regarding upper endoscopy including the risks, benefits and alternatives. Ample time for discussion and questions allowed. The patient understood, was satisfied, and wishes to proceed.  Should this test be negative, she reports she does not want to pursue colonoscopy.   --monitor Hgb      [name] M  05/04/2015, 8:09 AM

## 2015-05-04 NOTE — Progress Notes (Signed)
Utilization Review Completed.[name] T9/11/2014  

## 2015-05-04 NOTE — Progress Notes (Addendum)
[facility] PROGRESS NOTE   [name] TFT:[id number] DOB: 1924/04/25 DOA: 05/03/2015 PCP: Nicoletta Dress, MD  HPI/Subjective: Feels okay, no nausea or vomiting, no bowel movement since Thursday evening.  Assessment/Plan: Principal Problem:   Melanotic stools Active Problems:   Hypothyroidism   Essential hypertension   CKD (chronic kidney disease), stage III   Acute blood loss anemia   Anemia in chronic renal disease   GI bleed   Melena    Melanotic stools Presented with 3 day melanotic stools, average 1-2 BMs/day, last bowel movement 2 days PTA. Per report baseline hemoglobin about 9.9, patient presented with hemoglobin of 7.8 in [place] EGD. Transfer to [facility] because there is no GI coverage in [facility] in the weekend. Started on Protonix 40 mg IV twice a day. EGD done earlier today was 05/04/15 and showed no evidence of bleeding. Placed on regular diet, Protonix switched to Pepcid and stop Plavix for now.  Acute blood loss anemia Patient has acute blood loss anemia on top of anemia of chronic disease. Patient appears to have chronic anemia secondary to CKD stage 3-4. Check hemoglobin every 12 hours, transfuse if hemoglobin less than 8.0, patient has history of CAD. Patient already had 2 units of packed RBCs, hemoglobin appropriately increased to 10.2 after transfusion.  Essential hypertension Hold antihypertensives medications, because of concurrent bleed.  Hypothyroidism Check TSH, continue Synthroid dose.  Stage III-IV CKD Patient has stage III to stage IV CKD, per Dr. Elissa Hefty notes creatinine was 1.7 back in March 2016. Patient creatinine 2.0 according to [facility] EGD notes Patient started on IV fluids, check BMP in a.m.   Code Status: Full Code Family Communication: Plan discussed with the patient. Disposition Plan: Remains inpatient Diet: Diet regular Room service appropriate?: Yes; Fluid consistency:: Thin  Consultants:  GI     Procedures:  EGD done on 05/04/15 showed no evidence of bleeding source  Antibiotics:  None   Objective: Filed Vitals:   05/04/15 0913  BP: 117/36  Pulse:   Temp:   Resp: 20    Intake/Output Summary (Last 24 hours) at 05/04/15 1007 Last data filed at 05/04/15 0900  Gross per 24 hour  Intake 1649.25 ml  Output      0 ml  Net 1649.25 ml   Filed Weights   05/03/15 1709  Weight: 48.7 kg (107 lb 5.8 oz)    Exam: General: Alert and awake, oriented x3, not in any acute distress. HEENT: anicteric sclera, pupils reactive to light and accommodation, EOMI CVS: S1-S2 clear, no murmur rubs or gallops Chest: clear to auscultation bilaterally, no wheezing, rales or rhonchi Abdomen: soft nontender, nondistended, normal bowel sounds, no organomegaly Extremities: no cyanosis, clubbing or edema noted bilaterally Neuro: Cranial nerves II-XII intact, no focal neurological deficits  Data Reviewed: Basic Metabolic Panel:  Recent Labs Lab 05/03/15 2028 05/04/15 0557  NA 139 142  K 4.1 4.1  CL 108 113*  CO2 20* 20*  GLUCOSE 104* 72  BUN 61* 55*  CREATININE 1.69* 1.64*  CALCIUM 9.1 8.8*   Liver Function Tests: No results for input(s): AST, ALT, ALKPHOS, BILITOT, PROT, ALBUMIN in the last 168 hours. No results for input(s): LIPASE, AMYLASE in the last 168 hours. No results for input(s): AMMONIA in the last 168 hours. CBC:  Recent Labs Lab 05/03/15 2028 05/04/15 0557  WBC 10.7* 8.7  HGB 11.5* 10.2*  HCT 35.0* 31.7*  MCV 92.6 93.0  PLT 201 185   Cardiac Enzymes: No results for input(s): CKTOTAL, CKMB,  CKMBINDEX, TROPONINI in the last 168 hours. BNP (last 3 results)  Recent Labs  10/27/14 1511  BNP 198.3*    ProBNP (last 3 results) No results for input(s): PROBNP in the last 8760 hours.  CBG: No results for input(s): GLUCAP in the last 168 hours.  Micro Recent Results (from the past 240 hour(s))  MRSA PCR Screening     Status: None   Collection Time:  05/03/15  5:47 PM  Result Value Ref Range Status   MRSA by PCR NEGATIVE NEGATIVE Final    Comment:        The GeneXpert MRSA Assay (FDA approved for NASAL specimens only), is one component of a comprehensive MRSA colonization surveillance program. It is not intended to diagnose MRSA infection nor to guide or monitor treatment for MRSA infections.      Studies: No results found.  Scheduled Meds: . allopurinol  200 mg Oral Daily  . levothyroxine  75 mcg Oral QAC breakfast  . sodium chloride  3 mL Intravenous Q12H   Continuous Infusions: . sodium chloride 75 mL/hr (05/04/15 0900)       Time spent: 35 minutes    [facility] A  [facility] Pager [phone] If 7PM-7AM, please contact night-coverage at [email or web address], password [facility] 05/04/2015, 10:07 AM  LOS: 1 day

## 2015-05-04 NOTE — Progress Notes (Signed)
Pt refusing SCDs due to skin sensitivity/allergy to material.

## 2015-05-05 LAB — CBC
HCT: 30 % — ABNORMAL LOW (ref 36.0–46.0)
Hemoglobin: 9.7 g/dL — ABNORMAL LOW (ref 12.0–15.0)
MCH: 30.4 pg (ref 26.0–34.0)
MCHC: 32.3 g/dL (ref 30.0–36.0)
MCV: 94 fL (ref 78.0–100.0)
PLATELETS: 191 10*3/uL (ref 150–400)
RBC: 3.19 MIL/uL — ABNORMAL LOW (ref 3.87–5.11)
RDW: 18.4 % — AB (ref 11.5–15.5)
WBC: 11.8 10*3/uL — ABNORMAL HIGH (ref 4.0–10.5)

## 2015-05-05 LAB — BASIC METABOLIC PANEL
Anion gap: 9 (ref 5–15)
BUN: 48 mg/dL — AB (ref 6–20)
CALCIUM: 8.3 mg/dL — AB (ref 8.9–10.3)
CO2: 19 mmol/L — AB (ref 22–32)
CREATININE: 1.55 mg/dL — AB (ref 0.44–1.00)
Chloride: 113 mmol/L — ABNORMAL HIGH (ref 101–111)
GFR calc non Af Amer: 29 mL/min — ABNORMAL LOW (ref >60)
GFR, EST AFRICAN AMERICAN: 33 mL/min — AB (ref >60)
GLUCOSE: 116 mg/dL — AB (ref 65–99)
Potassium: 4 mmol/L (ref 3.5–5.1)
Sodium: 141 mmol/L (ref 135–145)

## 2015-05-05 MED ORDER — FAMOTIDINE 10 MG PO TABS: 10.0000 mg | ORAL_TABLET | Freq: Two times a day (BID) | ORAL | Status: DC | PRN

## 2015-05-05 NOTE — Discharge Summary (Signed)
Physician Discharge Summary  [name] XFG:[id number] DOB: [birth year] DOA: 05/03/2015  PCP: Nicoletta Dress, MD  Admit date: 05/03/2015 Discharge date: 05/05/2015  Time spent: 40 mg minutes  Recommendations for Outpatient Follow-up:  1. Follow-up with primary care physician in one week. 2. Continue to hold Plavix until PCP/primary cardiologist recommends reinitiation.  Discharge Diagnoses:  Principal Problem:   Melanotic stools Active Problems:   Hypothyroidism   Essential hypertension   CKD (chronic kidney disease), stage III   Acute blood loss anemia   Anemia in chronic renal disease   GI bleed   Melena   Discharge Condition: Stable  Diet recommendation: Heart healthy  Filed Weights   05/03/15 1709  Weight: 48.7 kg (107 lb 5.8 oz)    History of present illness:  [name] is a 81 y.o. female with past medical history of CKD stage 3-4, hypertension, AAA and CAD on Plavix. Patient came in to the hospital complaining about melanotic stools. Patient started to have tarry black and sticky stools 4 days prior to admission, average 1-2 bowel movements per day, denies any abdominal pain, patient also started to have generalized weakness and decline in her functional status the same time. She saw her primary care physician 1 day PTA and the hemoglobin came back at 7.4 so he sent her to [facility] for further eval. In [facility] ED hemoglobin is 7.8, she was sent to [facility] because lack of GI coverage in [facility] over the weekend. Vital signs are stable.   Hospital Course:   Melanotic stools Presented with 3 day melanotic stools, average 1-2 BMs/day, last bowel movement 2 days PTA. Per report baseline hemoglobin about 9.9, patient presented with hemoglobin of 7.8 in [place] EGD. Transfer to [facility] because there is no GI coverage in [facility] in the weekend. EGD done earlier today was 05/04/15 and showed no evidence of  bleeding. Placed on regular diet, Protonix switched to as needed Pepcid. Patient refused colonoscopy, GI recommended to continue to hold Plavix, last stent she had about 16 years ago. Hemoglobin is stable, melanotic stools clearing up.  Acute blood loss anemia Patient has acute blood loss anemia on top of anemia of chronic disease, reported she was getting Aranesp periodically as. Patient appears to have chronic anemia secondary to CKD stage 3-4. Check hemoglobin every 12 hours, transfuse if hemoglobin less than 8.0, patient has history of CAD. Patient already had 2 units of packed RBCs. Initial hemoglobin went up to 10.2 and dropped down to 9.7, still appropriate rise in hemoglobin from 7.4 after 2 units.  Essential hypertension Hold antihypertensives medications, because of concurrent bleed.  Hypothyroidism Check TSH, continue Synthroid dose.  Stage III-IV CKD Patient has stage III to stage IV CKD, per Dr. Delena Bali' notes creatinine was 1.7 back in March 2016. Patient creatinine 2.0 according to [facility] EGD notes Creatinine is 1.55 on discharge.   Procedures:  EGD done by Dr. Hilarie Fredrickson 05/04/2015 and showed no evidence of bleeding, patient refused colonoscopy.  Consultations:  Gastroenterology  Discharge Exam: Filed Vitals:   05/05/15 0629  BP: 166/50  Pulse: 93  Temp: 98.4 F (36.9 C)  Resp: 18   General: Alert and awake, oriented x3, not in any acute distress. HEENT: anicteric sclera, pupils reactive to light and accommodation, EOMI CVS: S1-S2 clear, no murmur rubs or gallops Chest: clear to auscultation bilaterally, no wheezing, rales or rhonchi Abdomen: soft nontender, nondistended, normal bowel sounds, no organomegaly Extremities: no cyanosis, clubbing or edema noted bilaterally  Neuro: Cranial nerves II-XII intact, no focal neurological deficits  Discharge Instructions   Discharge Instructions    Diet - low sodium heart healthy    Complete by:  As directed       Increase activity slowly    Complete by:  As directed           Current Discharge Medication List    START taking these medications   Details  famotidine (PEPCID) 10 MG tablet Take 1 tablet (10 mg total) by mouth every 12 (twelve) hours as needed for heartburn or indigestion. Qty: 60 tablet, Refills: 0      CONTINUE these medications which have NOT CHANGED   Details  acetaminophen (TYLENOL) 500 MG tablet Take 500 mg by mouth daily as needed for moderate pain.     ACTONEL 35 MG tablet Take 35 mg by mouth every Saturday.     allopurinol (ZYLOPRIM) 100 MG tablet Take 200 mg by mouth daily.     atorvastatin (LIPITOR) 40 MG tablet Take 40 mg by mouth every evening.    bisacodyl (DULCOLAX) 10 MG suppository Place 10 mg rectally daily as needed for constipation.    carboxymethylcellulose (REFRESH CELLUVISC) 1 % ophthalmic solution Apply 1 drop to eye 2 (two) times daily.     conjugated estrogens (PREMARIN) vaginal cream Place vaginally every Tuesday.    diphenhydrAMINE (BENADRYL) 25 MG tablet Take 25 mg by mouth as needed for itching or allergies.     ezetimibe (ZETIA) 10 MG tablet Take 10 mg by mouth daily.    furosemide (LASIX) 40 MG tablet Take 1 tablet (40 mg total) by mouth 2 (two) times daily. Qty: 60 tablet, Refills: 0    KLOR-CON M20 20 MEQ tablet Take 20 mEq by mouth 2 (two) times daily. Refills: 3    levothyroxine (SYNTHROID, LEVOTHROID) 75 MCG tablet Take 75 mcg by mouth daily.    loratadine (CLARITIN) 10 MG tablet Take 10 mg by mouth daily.    losartan (COZAAR) 25 MG tablet Take 50 mg by mouth daily.     psyllium (REGULOID) 0.52 G capsule Take 1.56 g by mouth daily.       Allergies  Allergen Reactions  . Contrast Media [Iodinated Diagnostic Agents] Itching  . Amlodipine Besylate Other (See Comments)    unknown  . Aspirin Swelling  . Cefdinir Nausea And Vomiting  . Cephalexin Nausea And Vomiting  . Chocolate Nausea And Vomiting    Migraines  . Codeine  Nausea And Vomiting and Other (See Comments)    dizziness  . Doxycycline Hyclate Nausea And Vomiting  . Erythromycin Nausea And Vomiting  . Levofloxacin Nausea And Vomiting  . Mometasone Other (See Comments)    Could not stand the odor  . Mucinex [Guaifenesin Er] Other (See Comments)    Paradoxical response  . Other Other (See Comments)    Oregano (swelling and bloating)  . Peanut-Containing Drug Products Nausea And Vomiting  . Progesterone Swelling and Other (See Comments)    Bloated  . Propoxyphene N-Acetaminophen Other (See Comments)    Dizziness, made her feel drunk  . Valsartan Nausea Only  . Tape Itching and Rash    Please use "paper" tape.   Follow-up Information    Follow up with SCHULTZ,DOUGLAS E, MD In 1 week.   Specialty:  Internal Medicine   Contact information:   [place] [address] [phone]        The results of significant diagnostics from this  hospitalization (including imaging, microbiology, ancillary and laboratory) are listed below for reference.    Significant Diagnostic Studies: No results found.  Microbiology: Recent Results (from the past 240 hour(s))  MRSA PCR Screening     Status: None   Collection Time: 05/03/15  5:47 PM  Result Value Ref Range Status   MRSA by PCR NEGATIVE NEGATIVE Final    Comment:        The GeneXpert MRSA Assay (FDA approved for NASAL specimens only), is one component of a comprehensive MRSA colonization surveillance program. It is not intended to diagnose MRSA infection nor to guide or monitor treatment for MRSA infections.      Labs: Basic Metabolic Panel:  Recent Labs Lab 05/03/15 2028 05/04/15 0557 05/05/15 0342  NA 139 142 141  K 4.1 4.1 4.0  CL 108 113* 113*  CO2 20* 20* 19*  GLUCOSE 104* 72 116*  BUN 61* 55* 48*  CREATININE 1.69* 1.64* 1.55*  CALCIUM 9.1 8.8* 8.3*   Liver Function Tests: No results for input(s): AST, ALT, ALKPHOS, BILITOT, PROT, ALBUMIN  in the last 168 hours. No results for input(s): LIPASE, AMYLASE in the last 168 hours. No results for input(s): AMMONIA in the last 168 hours. CBC:  Recent Labs Lab 05/03/15 2028 05/04/15 0557 05/05/15 0342  WBC 10.7* 8.7 11.8*  HGB 11.5* 10.2* 9.7*  HCT 35.0* 31.7* 30.0*  MCV 92.6 93.0 94.0  PLT 201 185 191   Cardiac Enzymes: No results for input(s): CKTOTAL, CKMB, CKMBINDEX, TROPONINI in the last 168 hours. BNP: BNP (last 3 results)  Recent Labs  10/27/14 1511  BNP 198.3*    ProBNP (last 3 results) No results for input(s): PROBNP in the last 8760 hours.  CBG: No results for input(s): GLUCAP in the last 168 hours.     Signed:  Sylvana Bonk A  [facility] 05/05/2015, 12:17 PM

## 2015-05-06 LAB — HEMOGLOBIN A1C
HEMOGLOBIN A1C: 5.4 % (ref 4.8–5.6)
MEAN PLASMA GLUCOSE: 108 mg/dL

## 2015-09-03 ENCOUNTER — Encounter (HOSPITAL_COMMUNITY): Payer: Self-pay | Admitting: *Deleted

## 2015-09-03 ENCOUNTER — Inpatient Hospital Stay (HOSPITAL_COMMUNITY)
Admission: EM | Admit: 2015-09-03 | Discharge: 2015-09-06 | DRG: 378 | Disposition: A | Discharge status: Home Health | Payer: Medicare Other | Attending: Internal Medicine | Admitting: Internal Medicine

## 2015-09-03 DIAGNOSIS — Z87891 Personal history of nicotine dependence: Secondary | ICD-10-CM

## 2015-09-03 DIAGNOSIS — D62 Acute posthemorrhagic anemia: Secondary | ICD-10-CM | POA: Diagnosis present

## 2015-09-03 DIAGNOSIS — K922 Gastrointestinal hemorrhage, unspecified: Secondary | ICD-10-CM | POA: Diagnosis present

## 2015-09-03 DIAGNOSIS — K5521 Angiodysplasia of colon with hemorrhage: Principal | ICD-10-CM | POA: Diagnosis present

## 2015-09-03 DIAGNOSIS — Z951 Presence of aortocoronary bypass graft: Secondary | ICD-10-CM

## 2015-09-03 DIAGNOSIS — N184 Chronic kidney disease, stage 4 (severe): Secondary | ICD-10-CM | POA: Diagnosis present

## 2015-09-03 DIAGNOSIS — M549 Dorsalgia, unspecified: Secondary | ICD-10-CM | POA: Diagnosis present

## 2015-09-03 DIAGNOSIS — K921 Melena: Secondary | ICD-10-CM

## 2015-09-03 DIAGNOSIS — D122 Benign neoplasm of ascending colon: Secondary | ICD-10-CM | POA: Diagnosis present

## 2015-09-03 DIAGNOSIS — K219 Gastro-esophageal reflux disease without esophagitis: Secondary | ICD-10-CM | POA: Diagnosis present

## 2015-09-03 DIAGNOSIS — I10 Essential (primary) hypertension: Secondary | ICD-10-CM | POA: Diagnosis present

## 2015-09-03 DIAGNOSIS — Z8249 Family history of ischemic heart disease and other diseases of the circulatory system: Secondary | ICD-10-CM

## 2015-09-03 DIAGNOSIS — I714 Abdominal aortic aneurysm, without rupture: Secondary | ICD-10-CM | POA: Diagnosis present

## 2015-09-03 DIAGNOSIS — E039 Hypothyroidism, unspecified: Secondary | ICD-10-CM | POA: Diagnosis present

## 2015-09-03 DIAGNOSIS — I2510 Atherosclerotic heart disease of native coronary artery without angina pectoris: Secondary | ICD-10-CM | POA: Diagnosis present

## 2015-09-03 DIAGNOSIS — K449 Diaphragmatic hernia without obstruction or gangrene: Secondary | ICD-10-CM | POA: Diagnosis present

## 2015-09-03 DIAGNOSIS — M810 Age-related osteoporosis without current pathological fracture: Secondary | ICD-10-CM | POA: Diagnosis present

## 2015-09-03 DIAGNOSIS — Z515 Encounter for palliative care: Secondary | ICD-10-CM

## 2015-09-03 DIAGNOSIS — Z955 Presence of coronary angioplasty implant and graft: Secondary | ICD-10-CM

## 2015-09-03 DIAGNOSIS — M109 Gout, unspecified: Secondary | ICD-10-CM | POA: Diagnosis present

## 2015-09-03 DIAGNOSIS — D649 Anemia, unspecified: Secondary | ICD-10-CM

## 2015-09-03 DIAGNOSIS — E785 Hyperlipidemia, unspecified: Secondary | ICD-10-CM | POA: Diagnosis present

## 2015-09-03 DIAGNOSIS — M542 Cervicalgia: Secondary | ICD-10-CM | POA: Diagnosis present

## 2015-09-03 DIAGNOSIS — I25810 Atherosclerosis of coronary artery bypass graft(s) without angina pectoris: Secondary | ICD-10-CM | POA: Diagnosis present

## 2015-09-03 DIAGNOSIS — G8929 Other chronic pain: Secondary | ICD-10-CM | POA: Diagnosis present

## 2015-09-03 DIAGNOSIS — I739 Peripheral vascular disease, unspecified: Secondary | ICD-10-CM | POA: Diagnosis present

## 2015-09-03 DIAGNOSIS — Z85828 Personal history of other malignant neoplasm of skin: Secondary | ICD-10-CM

## 2015-09-03 DIAGNOSIS — Z66 Do not resuscitate: Secondary | ICD-10-CM | POA: Diagnosis present

## 2015-09-03 DIAGNOSIS — Z79899 Other long term (current) drug therapy: Secondary | ICD-10-CM

## 2015-09-03 DIAGNOSIS — I129 Hypertensive chronic kidney disease with stage 1 through stage 4 chronic kidney disease, or unspecified chronic kidney disease: Secondary | ICD-10-CM | POA: Diagnosis present

## 2015-09-03 LAB — CBC
HCT: 22 % — ABNORMAL LOW (ref 36.0–46.0)
HEMOGLOBIN: 6.8 g/dL — AB (ref 12.0–15.0)
MCH: 31.2 pg (ref 26.0–34.0)
MCHC: 30.9 g/dL (ref 30.0–36.0)
MCV: 100.9 fL — AB (ref 78.0–100.0)
PLATELETS: 236 10*3/uL (ref 150–400)
RBC: 2.18 MIL/uL — AB (ref 3.87–5.11)
RDW: 16.1 % — ABNORMAL HIGH (ref 11.5–15.5)
WBC: 8.6 10*3/uL (ref 4.0–10.5)

## 2015-09-03 LAB — COMPREHENSIVE METABOLIC PANEL
ALT: 18 U/L (ref 14–54)
ANION GAP: 10 (ref 5–15)
AST: 32 U/L (ref 15–41)
Albumin: 3.4 g/dL — ABNORMAL LOW (ref 3.5–5.0)
Alkaline Phosphatase: 100 U/L (ref 38–126)
BILIRUBIN TOTAL: 0.2 mg/dL — AB (ref 0.3–1.2)
BUN: 93 mg/dL — ABNORMAL HIGH (ref 6–20)
CHLORIDE: 108 mmol/L (ref 101–111)
CO2: 20 mmol/L — ABNORMAL LOW (ref 22–32)
Calcium: 9 mg/dL (ref 8.9–10.3)
Creatinine, Ser: 1.88 mg/dL — ABNORMAL HIGH (ref 0.44–1.00)
GFR, EST AFRICAN AMERICAN: 26 mL/min — AB (ref >60)
GFR, EST NON AFRICAN AMERICAN: 23 mL/min — AB (ref >60)
Glucose, Bld: 107 mg/dL — ABNORMAL HIGH (ref 65–99)
POTASSIUM: 4.7 mmol/L (ref 3.5–5.1)
Sodium: 138 mmol/L (ref 135–145)
Total Protein: 7.3 g/dL (ref 6.5–8.1)

## 2015-09-03 NOTE — ED Notes (Signed)
Pt states that hospice checked her blood today and her hgb is 6.9. Hx of anemia that required blood transfusion. EGD was completed at that time with no bleed found.

## 2015-09-03 NOTE — ED Notes (Signed)
Critical HGB called from lab, edp [place] aware, no further orders at this time

## 2015-09-03 NOTE — ED Notes (Signed)
States that she was taken off of Plavix after the last enemic episode, still does not take plavix. Reports dark stools.

## 2015-09-04 ENCOUNTER — Encounter (HOSPITAL_COMMUNITY): Payer: Self-pay | Admitting: Family Medicine

## 2015-09-04 DIAGNOSIS — D62 Acute posthemorrhagic anemia: Secondary | ICD-10-CM

## 2015-09-04 DIAGNOSIS — K922 Gastrointestinal hemorrhage, unspecified: Secondary | ICD-10-CM | POA: Diagnosis present

## 2015-09-04 DIAGNOSIS — E039 Hypothyroidism, unspecified: Secondary | ICD-10-CM

## 2015-09-04 DIAGNOSIS — G8929 Other chronic pain: Secondary | ICD-10-CM | POA: Diagnosis present

## 2015-09-04 DIAGNOSIS — K921 Melena: Secondary | ICD-10-CM | POA: Diagnosis present

## 2015-09-04 DIAGNOSIS — K219 Gastro-esophageal reflux disease without esophagitis: Secondary | ICD-10-CM | POA: Diagnosis present

## 2015-09-04 DIAGNOSIS — D122 Benign neoplasm of ascending colon: Secondary | ICD-10-CM | POA: Diagnosis present

## 2015-09-04 DIAGNOSIS — Z515 Encounter for palliative care: Secondary | ICD-10-CM

## 2015-09-04 DIAGNOSIS — K449 Diaphragmatic hernia without obstruction or gangrene: Secondary | ICD-10-CM | POA: Diagnosis present

## 2015-09-04 DIAGNOSIS — Z8249 Family history of ischemic heart disease and other diseases of the circulatory system: Secondary | ICD-10-CM | POA: Diagnosis not present

## 2015-09-04 DIAGNOSIS — I25810 Atherosclerosis of coronary artery bypass graft(s) without angina pectoris: Secondary | ICD-10-CM | POA: Diagnosis not present

## 2015-09-04 DIAGNOSIS — M549 Dorsalgia, unspecified: Secondary | ICD-10-CM | POA: Diagnosis present

## 2015-09-04 DIAGNOSIS — I714 Abdominal aortic aneurysm, without rupture: Secondary | ICD-10-CM | POA: Diagnosis present

## 2015-09-04 DIAGNOSIS — M109 Gout, unspecified: Secondary | ICD-10-CM | POA: Diagnosis present

## 2015-09-04 DIAGNOSIS — Z66 Do not resuscitate: Secondary | ICD-10-CM | POA: Diagnosis present

## 2015-09-04 DIAGNOSIS — M810 Age-related osteoporosis without current pathological fracture: Secondary | ICD-10-CM | POA: Diagnosis present

## 2015-09-04 DIAGNOSIS — N184 Chronic kidney disease, stage 4 (severe): Secondary | ICD-10-CM | POA: Diagnosis present

## 2015-09-04 DIAGNOSIS — Z79899 Other long term (current) drug therapy: Secondary | ICD-10-CM | POA: Diagnosis not present

## 2015-09-04 DIAGNOSIS — Z85828 Personal history of other malignant neoplasm of skin: Secondary | ICD-10-CM | POA: Diagnosis not present

## 2015-09-04 DIAGNOSIS — Z955 Presence of coronary angioplasty implant and graft: Secondary | ICD-10-CM | POA: Diagnosis not present

## 2015-09-04 DIAGNOSIS — I10 Essential (primary) hypertension: Secondary | ICD-10-CM

## 2015-09-04 DIAGNOSIS — Z951 Presence of aortocoronary bypass graft: Secondary | ICD-10-CM | POA: Diagnosis not present

## 2015-09-04 DIAGNOSIS — D649 Anemia, unspecified: Secondary | ICD-10-CM | POA: Insufficient documentation

## 2015-09-04 DIAGNOSIS — I739 Peripheral vascular disease, unspecified: Secondary | ICD-10-CM | POA: Diagnosis present

## 2015-09-04 DIAGNOSIS — M542 Cervicalgia: Secondary | ICD-10-CM | POA: Diagnosis present

## 2015-09-04 DIAGNOSIS — E785 Hyperlipidemia, unspecified: Secondary | ICD-10-CM | POA: Diagnosis present

## 2015-09-04 DIAGNOSIS — K5521 Angiodysplasia of colon with hemorrhage: Secondary | ICD-10-CM | POA: Diagnosis present

## 2015-09-04 DIAGNOSIS — Z87891 Personal history of nicotine dependence: Secondary | ICD-10-CM | POA: Diagnosis not present

## 2015-09-04 DIAGNOSIS — I2510 Atherosclerotic heart disease of native coronary artery without angina pectoris: Secondary | ICD-10-CM | POA: Diagnosis present

## 2015-09-04 DIAGNOSIS — I129 Hypertensive chronic kidney disease with stage 1 through stage 4 chronic kidney disease, or unspecified chronic kidney disease: Secondary | ICD-10-CM | POA: Diagnosis present

## 2015-09-04 LAB — CBC WITH DIFFERENTIAL/PLATELET
BASOS PCT: 0 %
Basophils Absolute: 0 10*3/uL (ref 0.0–0.1)
Eosinophils Absolute: 0.5 10*3/uL (ref 0.0–0.7)
Eosinophils Relative: 6 %
HEMATOCRIT: 20.6 % — AB (ref 36.0–46.0)
HEMOGLOBIN: 6.6 g/dL — AB (ref 12.0–15.0)
LYMPHS ABS: 2.6 10*3/uL (ref 0.7–4.0)
LYMPHS PCT: 31 %
MCH: 32.4 pg (ref 26.0–34.0)
MCHC: 32 g/dL (ref 30.0–36.0)
MCV: 101 fL — AB (ref 78.0–100.0)
MONO ABS: 0.5 10*3/uL (ref 0.1–1.0)
MONOS PCT: 6 %
NEUTROS ABS: 4.9 10*3/uL (ref 1.7–7.7)
NEUTROS PCT: 57 %
Platelets: 200 10*3/uL (ref 150–400)
RBC: 2.04 MIL/uL — ABNORMAL LOW (ref 3.87–5.11)
RDW: 16 % — AB (ref 11.5–15.5)
WBC: 8.5 10*3/uL (ref 4.0–10.5)

## 2015-09-04 LAB — POC OCCULT BLOOD, ED: Fecal Occult Bld: POSITIVE — AB

## 2015-09-04 LAB — DIFFERENTIAL
BASOS ABS: 0 10*3/uL (ref 0.0–0.1)
Basophils Relative: 0 %
EOS ABS: 0.5 10*3/uL (ref 0.0–0.7)
Eosinophils Relative: 6 %
LYMPHS ABS: 2.3 10*3/uL (ref 0.7–4.0)
LYMPHS PCT: 28 %
MONOS PCT: 7 %
Monocytes Absolute: 0.6 10*3/uL (ref 0.1–1.0)
Neutro Abs: 4.9 10*3/uL (ref 1.7–7.7)
Neutrophils Relative %: 59 %

## 2015-09-04 LAB — PREPARE RBC (CROSSMATCH)

## 2015-09-04 MED ORDER — PANTOPRAZOLE SODIUM 40 MG PO TBEC
40.0000 mg | DELAYED_RELEASE_TABLET | Freq: Every day | ORAL | Status: DC
  Administered 2015-09-04 – 2015-09-06 (×3): 40 mg via ORAL
  Filled 2015-09-04 (×3): qty 1

## 2015-09-04 MED ORDER — EZETIMIBE 10 MG PO TABS
10.0000 mg | ORAL_TABLET | Freq: Every day | ORAL | Status: DC
  Administered 2015-09-04 – 2015-09-06 (×3): 10 mg via ORAL
  Filled 2015-09-04 (×3): qty 1

## 2015-09-04 MED ORDER — ACETAMINOPHEN 325 MG PO TABS
650.0000 mg | ORAL_TABLET | Freq: Four times a day (QID) | ORAL | Status: DC | PRN
  Administered 2015-09-04 – 2015-09-06 (×2): 650 mg via ORAL
  Filled 2015-09-04 (×2): qty 2

## 2015-09-04 MED ORDER — ATORVASTATIN CALCIUM 40 MG PO TABS
40.0000 mg | ORAL_TABLET | Freq: Every evening | ORAL | Status: DC
  Administered 2015-09-04 – 2015-09-05 (×2): 40 mg via ORAL
  Filled 2015-09-04 (×2): qty 1

## 2015-09-04 MED ORDER — ALLOPURINOL 100 MG PO TABS
200.0000 mg | ORAL_TABLET | Freq: Every day | ORAL | Status: DC
  Administered 2015-09-04 – 2015-09-06 (×3): 200 mg via ORAL
  Filled 2015-09-04 (×3): qty 2

## 2015-09-04 MED ORDER — LEVOTHYROXINE SODIUM 75 MCG PO TABS
75.0000 ug | ORAL_TABLET | Freq: Every day | ORAL | Status: DC
  Administered 2015-09-04 – 2015-09-06 (×3): 75 ug via ORAL
  Filled 2015-09-04 (×3): qty 1

## 2015-09-04 MED ORDER — SODIUM CHLORIDE 0.9 % IV SOLN
10.0000 mL/h | Freq: Once | INTRAVENOUS | Status: AC
Start: 2015-09-04 — End: 2015-09-04
  Administered 2015-09-04: 10 mL/h via INTRAVENOUS

## 2015-09-04 MED ORDER — LOSARTAN POTASSIUM 50 MG PO TABS
50.0000 mg | ORAL_TABLET | Freq: Every day | ORAL | Status: DC
Start: 2015-09-04 — End: 2015-09-06
  Administered 2015-09-04 – 2015-09-06 (×3): 50 mg via ORAL
  Filled 2015-09-04 (×3): qty 1

## 2015-09-04 MED ORDER — FAMOTIDINE 20 MG PO TABS: 10.0000 mg | ORAL_TABLET | Freq: Two times a day (BID) | ORAL | Status: DC | PRN

## 2015-09-04 MED ORDER — PEG 3350-KCL-NA BICARB-NACL 420 G PO SOLR
4000.0000 mL | Freq: Once | ORAL | Status: AC
  Administered 2015-09-04: 4000 mL via ORAL
  Filled 2015-09-04: qty 4000

## 2015-09-04 MED ORDER — SODIUM CHLORIDE 0.9 % IV SOLN
INTRAVENOUS | Status: DC
  Administered 2015-09-04: 14:00:00 via INTRAVENOUS

## 2015-09-04 MED ORDER — ACETAMINOPHEN 650 MG RE SUPP: 650.0000 mg | Freq: Four times a day (QID) | RECTAL | Status: DC | PRN

## 2015-09-04 NOTE — ED Provider Notes (Signed)
CSN: [id number]     Arrival date & time 09/03/15  1736 History  By signing my name below, I, Arianna Nassar, attest that this documentation has been prepared under the direction and in the presence of Everlene Balls, MD. Electronically Signed: Julien Nordmann, ED Scribe. 09/04/2015. 12:35 AM.    Chief Complaint  Patient presents with  . Anemia     The history is provided by the patient. No language interpreter was used.   HPI Comments: [name] is a 82 y.o. female who has a hx of anemia with blood transfusion, HTN, hyperlipidemia, osteoporosis, AAA, and PVD presents to the Emergency Department complaining of intermittent, gradual worsening melena onset a few months ago. She has been having associated generalized weakness, loss of appetite, and nausea.Pt notes she has been having very dark stools over the past few days. She has been under Hospice care and they checked her blood today with her HGB being 6.9.  Pt notes that she was on Plavix but stopped taking it after her last anemic episodes after Labor Day weekend. Pt denies vomiting. She had an EGD that was negative but refused colonoscopy at that time.  Past Medical History  Diagnosis Date  . Anemia   . Arthritis   . CKD (chronic kidney disease)   . Cancer ([place])   . Fall     Syncope secondary to low BP.  Injured back.   . Hyperlipidemia   . Hypertension   . Osteoporosis   . Hypothyroid   . AAA (abdominal aortic aneurysm) ([place])   . PVD (peripheral vascular disease) [facility])    Past Surgical History  Procedure Laterality Date  . Appendectomy    . Coronary artery bypass graft      2000  . Skin cancer resected    . Breast biopsy    . Coronary angioplasty with stent placement  2004    BMS x 2 to LIMA-LAD  . Esophagogastroduodenoscopy N/A 05/04/2015    Procedure: ESOPHAGOGASTRODUODENOSCOPY (EGD);  Surgeon: Jerene Bears, MD;  Location: [facility] ENDOSCOPY;  Service: Endoscopy;  Laterality: N/A;   Family History  Problem Relation Age of  Onset  . CAD Mother   . CAD Father    Social History  Substance Use Topics  . Smoking status: Former Research scientist (life sciences)  . Smokeless tobacco: None  . Alcohol Use: No   OB History    No data available     Review of Systems  A complete 10 system review of systems was obtained and all systems are negative except as noted in the HPI and PMH.    Allergies  Contrast media; Amlodipine besylate; Aspirin; Cefdinir; Cephalexin; Chocolate; Codeine; Doxycycline hyclate; Erythromycin; Levofloxacin; Mometasone; Mucinex; Other; Peanut-containing drug products; Progesterone; Propoxyphene n-acetaminophen; Valsartan; and Tape  Home Medications   Prior to Admission medications   Medication Sig Start Date End Date Taking? Authorizing Provider  acetaminophen (TYLENOL) 500 MG tablet Take 500 mg by mouth daily as needed for moderate pain.     Historical Provider, MD  ACTONEL 35 MG tablet Take 35 mg by mouth every Saturday.  04/12/12   Historical Provider, MD  allopurinol (ZYLOPRIM) 100 MG tablet Take 200 mg by mouth daily.  05/02/12   Historical Provider, MD  atorvastatin (LIPITOR) 40 MG tablet Take 40 mg by mouth every evening.    Historical Provider, MD  bisacodyl (DULCOLAX) 10 MG suppository Place 10 mg rectally daily as needed for constipation.    Historical Provider, MD  carboxymethylcellulose (REFRESH CELLUVISC) 1 %  ophthalmic solution Apply 1 drop to eye 2 (two) times daily.     Historical Provider, MD  conjugated estrogens (PREMARIN) vaginal cream Place vaginally every Tuesday.    Historical Provider, MD  diphenhydrAMINE (BENADRYL) 25 MG tablet Take 25 mg by mouth as needed for itching or allergies.     Historical Provider, MD  ezetimibe (ZETIA) 10 MG tablet Take 10 mg by mouth daily.    Historical Provider, MD  famotidine (PEPCID) 10 MG tablet Take 1 tablet (10 mg total) by mouth every 12 (twelve) hours as needed for heartburn or indigestion. 05/05/15   Verlee Monte, MD  furosemide (LASIX) 40 MG tablet Take 1  tablet (40 mg total) by mouth 2 (two) times daily. 04/07/13   Donne Hazel, MD  KLOR-CON M20 20 MEQ tablet Take 20 mEq by mouth 2 (two) times daily. 03/24/15   Historical Provider, MD  levothyroxine (SYNTHROID, LEVOTHROID) 75 MCG tablet Take 75 mcg by mouth daily.    Historical Provider, MD  loratadine (CLARITIN) 10 MG tablet Take 10 mg by mouth daily.    Historical Provider, MD  losartan (COZAAR) 25 MG tablet Take 50 mg by mouth daily.  05/11/14   Historical Provider, MD  psyllium (REGULOID) 0.52 G capsule Take 1.56 g by mouth daily.    Historical Provider, MD   Triage vitals: BP 188/42 mmHg  Pulse 86  Temp(Src) 98 F (36.7 C) (Oral)  Resp 16  SpO2 100% Physical Exam  Constitutional: She is oriented to person, place, and time. She appears well-developed and well-nourished. No distress.  HENT:  Head: Normocephalic and atraumatic.  Nose: Nose normal.  Mouth/Throat: Oropharynx is clear and moist. No oropharyngeal exudate.  Eyes: Conjunctivae and EOM are normal. Pupils are equal, round, and reactive to light. No scleral icterus.  Neck: Normal range of motion. Neck supple. No JVD present. No tracheal deviation present. No thyromegaly present.  Cardiovascular: Normal rate, regular rhythm and normal heart sounds.  Exam reveals no gallop and no friction rub.   No murmur heard. Pulmonary/Chest: Effort normal and breath sounds normal. No respiratory distress. She has no wheezes. She exhibits no tenderness.  Abdominal: Soft. Bowel sounds are normal. She exhibits no distension and no mass. There is no tenderness. There is no rebound and no guarding.  Periumbilical hernia easily reduceable  Musculoskeletal: Normal range of motion. She exhibits no edema or tenderness.  Lymphadenopathy:    She has no cervical adenopathy.  Neurological: She is alert and oriented to person, place, and time. No cranial nerve deficit. She exhibits normal muscle tone.  Skin: Skin is warm and dry. No rash noted. No erythema.  No pallor.  Nursing note and vitals reviewed.   ED Course  Procedures  DIAGNOSTIC STUDIES: Oxygen Saturation is 100% on RA, normal by my interpretation.  COORDINATION OF CARE:  12:31 AM Discussed treatment plan which includes rectal exam and admit to the hospital with pt at bedside and pt agreed to plan.  Labs Review Labs Reviewed  COMPREHENSIVE METABOLIC PANEL - Abnormal; Notable for the following:    CO2 20 (*)    Glucose, Bld 107 (*)    BUN 93 (*)    Creatinine, Ser 1.88 (*)    Albumin 3.4 (*)    Total Bilirubin 0.2 (*)    GFR calc non Af Amer 23 (*)    GFR calc Af Amer 26 (*)    All other components within normal limits  CBC - Abnormal; Notable for the following:  RBC 2.18 (*)    Hemoglobin 6.8 (*)    HCT 22.0 (*)    MCV 100.9 (*)    RDW 16.1 (*)    All other components within normal limits  CBC WITH DIFFERENTIAL/PLATELET - Abnormal; Notable for the following:    RBC 2.04 (*)    Hemoglobin 6.6 (*)    HCT 20.6 (*)    MCV 101.0 (*)    RDW 16.0 (*)    All other components within normal limits  POC OCCULT BLOOD, ED - Abnormal; Notable for the following:    Fecal Occult Bld POSITIVE (*)    All other components within normal limits  DIFFERENTIAL  CBC WITH DIFFERENTIAL/PLATELET  POC OCCULT BLOOD, ED  TYPE AND SCREEN  PREPARE RBC (CROSSMATCH)    Imaging Review No results found. I have personally reviewed and evaluated these images and lab results as part of my medical decision-making.   EKG Interpretation None      MDM   Final diagnoses:  None    Patient presents to the emergency department for weakness and dark black stools. Likely recurrence of her GI bleed. Hemoglobin is 6.8, this taken in triage 6 hours ago, will repeat now. Patient be admitted to the hospitalist for further care. She currently is stable without any tachycardia or lightheadedness in the bed. Type and screen has been sent. Hemoccult is positive.  I spoke with Dr. Loleta Books who accepts  the patient and is asking the ED to start blood transfusion.  This was done.  Patient will be admitted to med surg.  CRITICAL CARE Performed by: Everlene Balls   Total critical care time: 35 minutes - symptomatic anemia, blood transfusion  Critical care time was exclusive of separately billable procedures and treating other patients.  Critical care was necessary to treat or prevent imminent or life-threatening deterioration.  Critical care was time spent personally by me on the following activities: development of treatment plan with patient and/or surrogate as well as nursing, discussions with consultants, evaluation of patient's response to treatment, examination of patient, obtaining history from patient or surrogate, ordering and performing treatments and interventions, ordering and review of laboratory studies, ordering and review of radiographic studies, pulse oximetry and re-evaluation of patient's condition.    I personally performed the services described in this documentation, which was scribed in my presence. The recorded information has been reviewed and is accurate.     Everlene Balls, MD 09/04/15 [phone]

## 2015-09-04 NOTE — Progress Notes (Signed)
Pt admitted today from ED per stretcher accompanied by RN and a tech, pt oriented to the unit and equipment call light and phone within reach pt made comfortable in bed with her own home bed sheet, pt said she is allergic to either the detergent or the sheet makes her body ich  therefore pt brought her own sheet from home, i am waiting on lab to call me for blood to be transfused, pt complain of muscle cramp in her legs Pt said feel better after moving around admitting DR called said he will review pt in an hour, pt not on any pain med, will continue to monitor

## 2015-09-04 NOTE — Consult Note (Signed)
Reason for Consult: Anemia, melena, and heme positive stool Referring Physician: [facility]  [name] HPI: This is an 82 year old female with a PMH of CAD s/p CABG, CKD, AAA, hypothyroidism, and HTN admitted for melenic stools.  She started to have melena last week and she reports 3 black stools that was associated with dizziness.  She currently has melena.  The patient had a similar presentation in 05/2015 and she underwent further evaluation with Dr. Hilarie Fredrickson as an unassigned patient.  The EGD was negative for any bleeding source and a colonoscopy was recommended, but she declined.  The patient states that she had two prior colonoscopies with Dr. Odie Sera in [place] and she was told that she did not need any repeat screening procedures.  At that time, in 05/2015, she was taking Plavix, but this was discontinued.  Past Medical History  Diagnosis Date  . Anemia   . Arthritis   . CKD (chronic kidney disease)   . Cancer ([place])   . Fall     Syncope secondary to low BP.  Injured back.   . Hyperlipidemia   . Hypertension   . Osteoporosis   . Hypothyroid   . AAA (abdominal aortic aneurysm) ([place])   . PVD (peripheral vascular disease) [facility])     Past Surgical History  Procedure Laterality Date  . Appendectomy    . Coronary artery bypass graft      2000  . Skin cancer resected    . Breast biopsy    . Coronary angioplasty with stent placement  2004    BMS x 2 to LIMA-LAD  . Esophagogastroduodenoscopy N/A 05/04/2015    Procedure: ESOPHAGOGASTRODUODENOSCOPY (EGD);  Surgeon: Jerene Bears, MD;  Location: [facility] ENDOSCOPY;  Service: Endoscopy;  Laterality: N/A;    Family History  Problem Relation Age of Onset  . CAD Mother   . CAD Father     Social History:  reports that she has quit smoking. She does not have any smokeless tobacco history on file. She reports that she does not drink alcohol or use illicit drugs.  Allergies:  Allergies  Allergen Reactions  . Contrast Media  [Iodinated Diagnostic Agents] Itching  . Amlodipine Besylate Other (See Comments)    unknown  . Aspirin Swelling  . Cefdinir Nausea And Vomiting  . Cephalexin Nausea And Vomiting  . Chocolate Nausea And Vomiting    Migraines  . Codeine Nausea And Vomiting and Other (See Comments)    dizziness  . Doxycycline Hyclate Nausea And Vomiting  . Erythromycin Nausea And Vomiting  . Levofloxacin Nausea And Vomiting  . Mometasone Other (See Comments)    Could not stand the odor  . Mucinex [Guaifenesin Er] Other (See Comments)    Paradoxical response  . Other Other (See Comments)    Oregano (swelling and bloating)  . Peanut-Containing Drug Products Nausea And Vomiting  . Progesterone Swelling and Other (See Comments)    Bloated  . Propoxyphene N-Acetaminophen Other (See Comments)    Dizziness, made her feel drunk  . Valsartan Nausea Only  . Tape Itching and Rash    Please use "paper" tape.    Medications:  Scheduled: . allopurinol  200 mg Oral Daily  . atorvastatin  40 mg Oral QPM  . ezetimibe  10 mg Oral Daily  . levothyroxine  75 mcg Oral QAC breakfast  . losartan  50 mg Oral Daily  . pantoprazole  40 mg Oral Daily   Continuous: . sodium chloride 75 mL/hr at  09/04/15 1330    Results for orders placed or performed during the hospital encounter of 09/03/15 (from the past 24 hour(s))  Comprehensive metabolic panel     Status: Abnormal   Collection Time: 09/03/15  6:01 PM  Result Value Ref Range   Sodium 138 135 - 145 mmol/L   Potassium 4.7 3.5 - 5.1 mmol/L   Chloride 108 101 - 111 mmol/L   CO2 20 (L) 22 - 32 mmol/L   Glucose, Bld 107 (H) 65 - 99 mg/dL   BUN 93 (H) 6 - 20 mg/dL   Creatinine, Ser 1.88 (H) 0.44 - 1.00 mg/dL   Calcium 9.0 8.9 - 10.3 mg/dL   Total Protein 7.3 6.5 - 8.1 g/dL   Albumin 3.4 (L) 3.5 - 5.0 g/dL   AST 32 15 - 41 U/L   ALT 18 14 - 54 U/L   Alkaline Phosphatase 100 38 - 126 U/L   Total Bilirubin 0.2 (L) 0.3 - 1.2 mg/dL   GFR calc non Af Amer 23 (L)  >60 mL/min   GFR calc Af Amer 26 (L) >60 mL/min   Anion gap 10 5 - 15  CBC     Status: Abnormal   Collection Time: 09/03/15  6:01 PM  Result Value Ref Range   WBC 8.6 4.0 - 10.5 K/uL   RBC 2.18 (L) 3.87 - 5.11 MIL/uL   Hemoglobin 6.8 (LL) 12.0 - 15.0 g/dL   HCT 22.0 (L) 36.0 - 46.0 %   MCV 100.9 (H) 78.0 - 100.0 fL   MCH 31.2 26.0 - 34.0 pg   MCHC 30.9 30.0 - 36.0 g/dL   RDW 16.1 (H) 11.5 - 15.5 %   Platelets 236 150 - 400 K/uL  Type and screen [place]     Status: None (Preliminary result)   Collection Time: 09/03/15  6:01 PM  Result Value Ref Range   ABO/RH(D) A POS    Antibody Screen NEG    Sample Expiration 09/06/2015    Unit Number [id number]    Blood Component Type RED CELLS,LR    Unit division 00    Status of Unit ISSUED    Transfusion Status OK TO TRANSFUSE    Crossmatch Result Compatible    Unit Number [id number]    Blood Component Type RED CELLS,LR    Unit division 00    Status of Unit ISSUED    Transfusion Status OK TO TRANSFUSE    Crossmatch Result Compatible   Differential     Status: None   Collection Time: 09/03/15  6:01 PM  Result Value Ref Range   Neutrophils Relative % 59 %   Neutro Abs 4.9 1.7 - 7.7 K/uL   Lymphocytes Relative 28 %   Lymphs Abs 2.3 0.7 - 4.0 K/uL   Monocytes Relative 7 %   Monocytes Absolute 0.6 0.1 - 1.0 K/uL   Eosinophils Relative 6 %   Eosinophils Absolute 0.5 0.0 - 0.7 K/uL   Basophils Relative 0 %   Basophils Absolute 0.0 0.0 - 0.1 K/uL  POC occult blood, ED     Status: Abnormal   Collection Time: 09/04/15 12:53 AM  Result Value Ref Range   Fecal Occult Bld POSITIVE (A) NEGATIVE  CBC with Differential/Platelet     Status: Abnormal   Collection Time: 09/04/15 12:58 AM  Result Value Ref Range   WBC 8.5 4.0 - 10.5 K/uL   RBC 2.04 (L) 3.87 - 5.11 MIL/uL   Hemoglobin 6.6 (LL) 12.0 - 15.0  g/dL   HCT 20.6 (L) 36.0 - 46.0 %   MCV 101.0 (H) 78.0 - 100.0 fL   MCH 32.4 26.0 - 34.0 pg   MCHC 32.0 30.0 -  36.0 g/dL   RDW 16.0 (H) 11.5 - 15.5 %   Platelets 200 150 - 400 K/uL   Neutrophils Relative % 57 %   Neutro Abs 4.9 1.7 - 7.7 K/uL   Lymphocytes Relative 31 %   Lymphs Abs 2.6 0.7 - 4.0 K/uL   Monocytes Relative 6 %   Monocytes Absolute 0.5 0.1 - 1.0 K/uL   Eosinophils Relative 6 %   Eosinophils Absolute 0.5 0.0 - 0.7 K/uL   Basophils Relative 0 %   Basophils Absolute 0.0 0.0 - 0.1 K/uL  Prepare RBC     Status: None   Collection Time: 09/04/15  1:30 AM  Result Value Ref Range   Order Confirmation ORDER PROCESSED BY BLOOD BANK      No results found.  ROS:  As stated above in the HPI otherwise negative.  Blood pressure 163/48, pulse 72, temperature 97.9 F (36.6 C), temperature source Oral, resp. rate 20, SpO2 98 %.    PE: Gen: NAD, Alert and Oriented HEENT:  [place]/AT, EOMI Neck: Supple, no LAD Lungs: CTA Bilaterally CV: RRR without M/G/R ABM: Soft, NTND, +BS Ext: No C/C/E  Assessment/Plan: 1) Melena. 2) Anemia. 3) Heme positive stool.   She is agreeable to the colonoscopy.  I will pursue the lower first since she had a relatively recent EGD.  If the colonoscopy is negative, I will perform the EGD at the same time.  Plan: 1) Colonoscopy +/- EGD.  [name] D 09/04/2015, 5:01 PM

## 2015-09-04 NOTE — ED Notes (Signed)
CBC with diff tube sent to lab.

## 2015-09-04 NOTE — Progress Notes (Signed)
   09/04/15 1640  Clinical Encounter Type  Visited With Patient  Visit Type Spiritual support  Referral From Nurse  Spiritual Encounters  Spiritual Needs Prayer;Emotional  Stress Factors  Patient Stress Factors Exhausted;Family relationships;Health changes;Lack of knowledge  Patient expressed concern about her husband's health and safety while she is in hospital, but he is being put into Hospice for 5 days, which will help. She also is worried about what is causing her health problems and said that she is not sure she would want to go through whatever treatment might be recommended for what they find, but that her family would want her to. Chaplain intends to visit again.

## 2015-09-04 NOTE — Progress Notes (Addendum)
  82 y.o. female with a past medical history significant for CAD s/p CABG, AAA, CKD III-IV, chronic back pain for two years, HTN, extensive medicine intolerances and hypothyroidism who presents with melena.  The patient presented with melanotic stools, she had an upper endoscopy that was unrevealing, and refused colonoscopy. She was taken off Plavix and discharged.  She is been in her normal state of health until last week, when she developed 3 dark, black stools, and dizziness. So she presented back to the ER.  In the ED, she was hemodynamically stable and had a hemoglobin of 6.8 g/dL. Transfuse with 2 units of packed red blood cells    Assessment and plan Melanoma Presented with 3 day melanotic stools, hemoglobin 6.8, receiving 2 units of packed red blood cells Prior EGD showed 05/04/15 no evidence of bleeding. Patient refused colonoscopy, GI recommended to continue to hold Plavix, last stent she had about 16 years ago. Follow CBC GI consultation, paged Dr Benson [place] Apparently on hospice at home , came back for transfusion dependent anemia     Acute blood loss anemia Patient has acute blood loss anemia on top of anemia of chronic disease, reported she was getting Aranesp periodically as. Patient appears to have chronic anemia secondary to CKD stage 3-4. Check hemoglobin every 12 hours, transfuse if hemoglobin less than 8.0, patient has history of CAD. Patient already had 2 units of packed RBCs.   Essential hypertension Hold antihypertensives medications, because of concurrent bleed.  Hypothyroidism  continue Synthroid dose.  Stage III-IV CKD Patient has stage III to stage IV CKD, per Dr. Delena Bali' notes creatinine was 1.7 back in March 2016. Patient creatinine 1.88

## 2015-09-04 NOTE — Consult Note (Signed)
Consultation Note Date: 09/04/2015   Patient Name: [name]  DOB: 02-Nov-1923  MRN: [id number]  Age / Sex: 82 y.o., female  PCP: Paulina Fusi, MD Referring Physician: Richarda Overlie, MD  Reason for Consultation: Establishing goals of care    Clinical Assessment/Narrative: I met today with [name]. She is visible upset when I come to visit and tells me about how ill her husband has been and that when she came to the hospital he had a fall at home before her granddaughter could get to be with him. She said that her granddaughter is upset and she is worried more about her husband than herself at this point. He is being transferred to [facility] [place] facility for respite care. I encouraged her that he will be in a safe place and will have excellent care while she is in a safe place and we will take good care of her as well. She says that her husband has been more ill than she is and they both have hospice care at home. She is worried about needing a colonoscopy and says she doesn't really want this (says that she would have been content to stay home and "do nothing") but her husband wants for her to have her bleeding worked up. She says she will do this for her family. We also discussed her wishes as far as code status and she endorses DNR. Emotional support provided.  I also spoke with daughter, [name], and updated her on our conversation and that we are awaiting recommendations from GI as far as our next steps. She confirms DNR as well. I will follow up with them tomorrow.   Contacts/Participants in Discussion: Primary Decision Maker: Self   Relationship to Patient next is her daughter  SUMMARY OF RECOMMENDATIONS - DNR - Open to testing as recommended from GI - Will return home and resume hospice care (if they have support to stay in home)  Code Status/Advance Care Planning: DNR    Code Status  Orders        Start     Ordered   09/04/15 1338  Do not attempt resuscitation (DNR)   Continuous    Question Answer Comment  In the event of cardiac or respiratory ARREST Do not call a "code blue"   In the event of cardiac or respiratory ARREST Do not perform Intubation, CPR, defibrillation or ACLS   In the event of cardiac or respiratory ARREST Use medication by any route, position, wound care, and other measures to relive pain and suffering. May use oxygen, suction and manual treatment of airway obstruction as needed for comfort.      09/04/15 1337       Symptom Management:   Chronic neck pain from h/o fall: Hurts when she moves neck and is active. She says she is very sensitive to medications and prefers to just rest.   Palliative Prophylaxis:   Bowel Regimen, Frequent Pain Assessment and Turn Reposition  Psycho-social/Spiritual:  Support System: Adequate Desire for further Chaplaincy support:yes Additional Recommendations: Caregiving  Support/Resources, Formal Capacity Evaluation and Grief/Bereavement Support  Prognosis: < 6 months  Discharge Planning: Home with Home Health   Chief Complaint/ Primary Diagnoses: Present on Admission:  . Essential hypertension . Hypothyroidism . CAD, ARTERY BYPASS GRAFT . Acute blood loss anemia . Melena . GIB (gastrointestinal bleeding)  I have reviewed the medical record, interviewed the patient and family, and examined the patient. The following aspects are pertinent.  Past Medical History  Diagnosis  Date  . Anemia   . Arthritis   . CKD (chronic kidney disease)   . Cancer ([place])   . Fall     Syncope secondary to low BP.  Injured back.   . Hyperlipidemia   . Hypertension   . Osteoporosis   . Hypothyroid   . AAA (abdominal aortic aneurysm) ([place])   . PVD (peripheral vascular disease) ([place])    Social History   Social History  . Marital Status: Married    Spouse Name: N/A  . Number of Children: 1  . Years of Education:  N/A   Occupational History  . Retired    Social History Main Topics  . Smoking status: Former Research scientist (life sciences)  . Smokeless tobacco: None  . Alcohol Use: No  . Drug Use: No  . Sexual Activity: No   Other Topics Concern  . None   Social History Narrative   Lives with husband   Family History  Problem Relation Age of Onset  . CAD Mother   . CAD Father    Scheduled Meds: . allopurinol  200 mg Oral Daily  . atorvastatin  40 mg Oral QPM  . ezetimibe  10 mg Oral Daily  . levothyroxine  75 mcg Oral QAC breakfast  . losartan  50 mg Oral Daily  . pantoprazole  40 mg Oral Daily   Continuous Infusions: . sodium chloride 75 mL/hr at 09/04/15 1330   PRN Meds:.acetaminophen **OR** acetaminophen, famotidine Medications Prior to Admission:  Prior to Admission medications   Medication Sig Start Date End Date Taking? Authorizing Provider  acetaminophen (TYLENOL) 500 MG tablet Take 500 mg by mouth daily as needed for moderate pain.    Yes Historical Provider, MD  ACTONEL 35 MG tablet Take 35 mg by mouth every Saturday.  04/12/12  Yes Historical Provider, MD  allopurinol (ZYLOPRIM) 100 MG tablet Take 200 mg by mouth daily.  05/02/12  Yes Historical Provider, MD  atorvastatin (LIPITOR) 40 MG tablet Take 40 mg by mouth every evening.   Yes Historical Provider, MD  bisacodyl (DULCOLAX) 5 MG EC tablet Take 5 mg by mouth daily as needed for mild constipation or moderate constipation.   Yes Historical Provider, MD  conjugated estrogens (PREMARIN) vaginal cream Place vaginally every Tuesday.   Yes Historical Provider, MD  ezetimibe (ZETIA) 10 MG tablet Take 10 mg by mouth daily.   Yes Historical Provider, MD  furosemide (LASIX) 40 MG tablet Take 1 tablet (40 mg total) by mouth 2 (two) times daily. 04/07/13  Yes Donne Hazel, MD  hydroxypropyl methylcellulose / hypromellose (ISOPTO TEARS / GONIOVISC) 2.5 % ophthalmic solution Place 1 drop into both eyes daily as needed for dry eyes.   Yes Historical Provider,  MD  KLOR-CON M20 20 MEQ tablet Take 20 mEq by mouth 2 (two) times daily. 03/24/15  Yes Historical Provider, MD  levothyroxine (SYNTHROID, LEVOTHROID) 75 MCG tablet Take 75 mcg by mouth daily.   Yes Historical Provider, MD  loratadine (CLARITIN) 10 MG tablet Take 10 mg by mouth daily.   Yes Historical Provider, MD  losartan (COZAAR) 25 MG tablet Take 50 mg by mouth daily.  05/11/14  Yes Historical Provider, MD  Psyllium (METAMUCIL PO) Take 1 capsule by mouth 3 (three) times daily.   Yes Historical Provider, MD  sodium chloride (OCEAN) 0.65 % SOLN nasal spray Place 1 spray into both nostrils daily as needed for congestion.   Yes Historical Provider, MD  famotidine (PEPCID) 10 MG tablet Take 1 tablet (  10 mg total) by mouth every 12 (twelve) hours as needed for heartburn or indigestion. 05/05/15   Verlee Monte, MD   Allergies  Allergen Reactions  . Contrast Media [Iodinated Diagnostic Agents] Itching  . Amlodipine Besylate Other (See Comments)    unknown  . Aspirin Swelling  . Cefdinir Nausea And Vomiting  . Cephalexin Nausea And Vomiting  . Chocolate Nausea And Vomiting    Migraines  . Codeine Nausea And Vomiting and Other (See Comments)    dizziness  . Doxycycline Hyclate Nausea And Vomiting  . Erythromycin Nausea And Vomiting  . Levofloxacin Nausea And Vomiting  . Mometasone Other (See Comments)    Could not stand the odor  . Mucinex [Guaifenesin Er] Other (See Comments)    Paradoxical response  . Other Other (See Comments)    Oregano (swelling and bloating)  . Peanut-Containing Drug Products Nausea And Vomiting  . Progesterone Swelling and Other (See Comments)    Bloated  . Propoxyphene N-Acetaminophen Other (See Comments)    Dizziness, made her feel drunk  . Valsartan Nausea Only  . Tape Itching and Rash    Please use "paper" tape.    Review of Systems  Gastrointestinal: Positive for blood in stool.  Musculoskeletal: Positive for neck pain.  Neurological: Positive for  weakness.    Physical Exam  Constitutional: She is oriented to person, place, and time. She appears well-developed.  HENT:  Head: Normocephalic and atraumatic.  Cardiovascular: Normal rate.   Respiratory: Effort normal. No accessory muscle usage. No tachypnea. No respiratory distress.  GI: Normal appearance.  Neurological: She is alert and oriented to person, place, and time.    Vital Signs: BP 157/77 mmHg  Pulse 71  Temp(Src) 97.5 F (36.4 C) (Oral)  Resp 18  SpO2 99%  SpO2: SpO2: 99 % O2 Device:SpO2: 99 % O2 Flow Rate: .   IO: Intake/output summary:  Intake/Output Summary (Last 24 hours) at 09/04/15 1337 Last data filed at 09/04/15 1233  Gross per 24 hour  Intake   1028 ml  Output      0 ml  Net   1028 ml    LBM: Last BM Date: 09/01/15 Baseline Weight:   Most recent weight:        Palliative Assessment/Data:  Flowsheet Rows        Most Recent Value   Intake Tab    Referral Department  Hospitalist   Unit at Time of Referral  Med/Surg Unit   Palliative Care Primary Diagnosis  Cancer   Date Notified  09/04/15   Palliative Care Type  New Palliative care   Reason for referral  Clarify Goals of Care   Date of Admission  09/03/15   # of days IP prior to Palliative referral  1   Clinical Assessment    Psychosocial & Spiritual Assessment    Palliative Care Outcomes       Additional Data Reviewed:  CBC:    Component Value Date/Time   WBC 8.5 09/04/2015 0058   HGB 6.6* 09/04/2015 0058   HCT 20.6* 09/04/2015 0058   PLT 200 09/04/2015 0058   MCV 101.0* 09/04/2015 0058   NEUTROABS 4.9 09/04/2015 0058   LYMPHSABS 2.6 09/04/2015 0058   MONOABS 0.5 09/04/2015 0058   EOSABS 0.5 09/04/2015 0058   BASOSABS 0.0 09/04/2015 0058   Comprehensive Metabolic Panel:    Component Value Date/Time   NA 138 09/03/2015 1801   K 4.7 09/03/2015 1801   CL 108 09/03/2015 1801  CO2 20* 09/03/2015 1801   BUN 93* 09/03/2015 1801   CREATININE 1.88* 09/03/2015 1801   GLUCOSE  107* 09/03/2015 1801   CALCIUM 9.0 09/03/2015 1801   AST 32 09/03/2015 1801   ALT 18 09/03/2015 1801   ALKPHOS 100 09/03/2015 1801   BILITOT 0.2* 09/03/2015 1801   PROT 7.3 09/03/2015 1801   ALBUMIN 3.4* 09/03/2015 1801     Time In: 1200 Time Out: 1320 Time Total: 88mn Greater than 50%  of this time was spent counseling and coordinating care related to the above assessment and plan.  Signed by: PPershing Proud NP  APershing Proud NP  13/02/8587 1:37 PM  Please contact Palliative Medicine Team phone at [phone] questions and concerns.

## 2015-09-04 NOTE — Progress Notes (Addendum)
Pt refuses SCD's, states her skin is very sensitive. Informed Dr. Allyson Sabal.

## 2015-09-04 NOTE — H&P (Signed)
History and Physical  Patient Name: [name]     [id number]    DOB: 1924-02-03    DOA: 09/03/2015 Referring physician: Michaele Offer, MD PCP: Nicoletta Dress, MD      Chief Complaint: Melena  HPI: [name] is a 82 y.o. female with a past medical history significant for CAD s/p CABG, AAA, CKD III-IV, chronic back pain for two years, HTN, extensive medicine intolerances and hypothyroidism who presents with melena.  The patient presented with melanotic stools, she had an upper endoscopy that was unrevealing, and refused colonoscopy. She was taken off Plavix and discharged.  She is been in her normal state of health until last week, when she developed 3 dark, black stools, and dizziness. So she presented back to the ER.  In the ED, she was hemodynamically stable and had a hemoglobin of 6.8 g/dL. She had no gross bleeding, no further bleeding events. Her electrolytes and renal function were close to baseline. She was typed and screened and [facility] were asked to admit for lower GI bleeding.  Of note, the patient also reports one week of increasing her chronic neck and upper back pain. This is in the muscles and in the spine. The results from a fall 2 years ago she describes. It is worse with movement. It is worse with turning his neck. It is severe, it is not relieved with any over-the-counter medicines. She has not seen orthopedics, she has done physical therapy which did not help. This appears to be her primary concern rather than GI bleeding.     Review of Systems:  Pt complains of neck pain, back pain, dark stools, dizziness. Pt denies any hematemesis, bright red rectal bleeding.  All other systems negative except as just noted or noted in the history of present illness.  Allergies  Allergen Reactions  . Contrast Media [Iodinated Diagnostic Agents] Itching  . Amlodipine Besylate Other (See Comments)    unknown  . Aspirin Swelling  . Cefdinir Nausea And Vomiting  .  Cephalexin Nausea And Vomiting  . Chocolate Nausea And Vomiting    Migraines  . Codeine Nausea And Vomiting and Other (See Comments)    dizziness  . Doxycycline Hyclate Nausea And Vomiting  . Erythromycin Nausea And Vomiting  . Levofloxacin Nausea And Vomiting  . Mometasone Other (See Comments)    Could not stand the odor  . Mucinex [Guaifenesin Er] Other (See Comments)    Paradoxical response  . Other Other (See Comments)    Oregano (swelling and bloating)  . Peanut-Containing Drug Products Nausea And Vomiting  . Progesterone Swelling and Other (See Comments)    Bloated  . Propoxyphene N-Acetaminophen Other (See Comments)    Dizziness, made her feel drunk  . Valsartan Nausea Only  . Tape Itching and Rash    Please use "paper" tape.    Prior to Admission medications   Medication Sig Start Date End Date Taking? Authorizing Provider  acetaminophen (TYLENOL) 500 MG tablet Take 500 mg by mouth daily as needed for moderate pain.    Yes Historical Provider, MD  ACTONEL 35 MG tablet Take 35 mg by mouth every Saturday.  04/12/12  Yes Historical Provider, MD  allopurinol (ZYLOPRIM) 100 MG tablet Take 200 mg by mouth daily.  05/02/12  Yes Historical Provider, MD  atorvastatin (LIPITOR) 40 MG tablet Take 40 mg by mouth every evening.   Yes Historical Provider, MD  bisacodyl (DULCOLAX) 5 MG EC tablet Take 5 mg by mouth daily as needed for  mild constipation or moderate constipation.   Yes Historical Provider, MD  conjugated estrogens (PREMARIN) vaginal cream Place vaginally every Tuesday.   Yes Historical Provider, MD  ezetimibe (ZETIA) 10 MG tablet Take 10 mg by mouth daily.   Yes Historical Provider, MD  furosemide (LASIX) 40 MG tablet Take 1 tablet (40 mg total) by mouth 2 (two) times daily. 04/07/13  Yes Donne Hazel, MD  hydroxypropyl methylcellulose / hypromellose (ISOPTO TEARS / GONIOVISC) 2.5 % ophthalmic solution Place 1 drop into both eyes daily as needed for dry eyes.   Yes Historical  Provider, MD  KLOR-CON M20 20 MEQ tablet Take 20 mEq by mouth 2 (two) times daily. 03/24/15  Yes Historical Provider, MD  levothyroxine (SYNTHROID, LEVOTHROID) 75 MCG tablet Take 75 mcg by mouth daily.   Yes Historical Provider, MD  loratadine (CLARITIN) 10 MG tablet Take 10 mg by mouth daily.   Yes Historical Provider, MD  losartan (COZAAR) 25 MG tablet Take 50 mg by mouth daily.  05/11/14  Yes Historical Provider, MD  Psyllium (METAMUCIL PO) Take 1 capsule by mouth 3 (three) times daily.   Yes Historical Provider, MD  sodium chloride (OCEAN) 0.65 % SOLN nasal spray Place 1 spray into both nostrils daily as needed for congestion.   Yes Historical Provider, MD  famotidine (PEPCID) 10 MG tablet Take 1 tablet (10 mg total) by mouth every 12 (twelve) hours as needed for heartburn or indigestion. 05/05/15   Verlee Monte, MD    Past Medical History  Diagnosis Date  . Anemia   . Arthritis   . CKD (chronic kidney disease)   . Cancer ([place])   . Fall     Syncope secondary to low BP.  Injured back.   . Hyperlipidemia   . Hypertension   . Osteoporosis   . Hypothyroid   . AAA (abdominal aortic aneurysm) ([place])   . PVD (peripheral vascular disease) [facility])     Past Surgical History  Procedure Laterality Date  . Appendectomy    . Coronary artery bypass graft      2000  . Skin cancer resected    . Breast biopsy    . Coronary angioplasty with stent placement  2004    BMS x 2 to LIMA-LAD  . Esophagogastroduodenoscopy N/A 05/04/2015    Procedure: ESOPHAGOGASTRODUODENOSCOPY (EGD);  Surgeon: Jerene Bears, MD;  Location[facility] ENDOSCOPY;  Service: Endoscopy;  Laterality: N/A;    Family history: family history includes CAD in her father and mother.  Social History: Patient lives with her husband, and feels that she can't care for him.  She is ambulatory at baseline.  She does not smoke.        Physical Exam: BP 145/41 mmHg  Pulse 80  Temp(Src) 97.9 F (36.6 C) (Oral)  Resp 16  SpO2 97% General  appearance: Frail elderly female, alert and in no acute distress.   Eyes: Anicteric, conjunctiva pink, lids and lashes normal.     ENT: No nasal deformity, discharge, or epistaxis.  OP moist without lesions.   Skin: Warm and dry.  No jaundice or pallor. Cardiac: RRR, nl [id number], loud systolic murmur.  No LE edema.  Radial pulses 2+ and symmetric. Respiratory: Normal respiratory rate and rhythm.  CTAB without rales or wheezes. Abdomen: Abdomen soft without rigidity.  No TTP. Ventral hernia, that is without induration or redness or pain.   MSK: Finger deformed by osteoarthritis.  Vertebral tenderness moderate at T4 level.  Paraspinal tenderness throughout upper back  and trapezius tendernes bilaterally. Neuro: Sensorium intact and responding to questions, attention normal.  Speech is fluent.  Moves all extremities equally and with normal coordination.    Psych: Behavior appropriate.  Affect anxious.  No evidence of aural or visual hallucinations or delusions.       Labs on Admission:  The metabolic panel shows slightly low bicarbonate, creatinine 1.8 mg/dL from baseline 1.5 mg/dL. Transaminases and bili normal. The complete blood count shows hemoglobin 6.8 g/dL.    Assessment/Plan 1. GIB and acute blood loss anemia:  -Transfuse two units now and then post-transfusion H/H -Consult to GI for evaluation for endoscopy/colonoscopy -Consult to palliative medicine for GOC in the face of patient's previous refusal and now expressed wishes not to have this kind of intervention done -Clear liquid diet -Doubt upper source, given previous normal EGD, and so pantoprazole held on admission  2. Back pain:  The patient has chronic back and neck pain, and seems to me that this is her primary presenting complaint, not bleeding. -Continue tylenol, patient intolerant to opioids -Consult to palliative care for recommendations regarding pain control, and then referral back to PCP  3. CAD:  -Contineu statin,  Zetia, losartan  4. CKD stage IV:  -Avoid nephrotoxins -Gentle fluids  5. Hypothyroidism:  -Continue home levothyroxine  6. Gout:  Stable.  -Contineu allopurinol  7. GERD:  Stable.  -Continue famotidine     DVT PPx: SCDs Diet: Clear Consultants: GI and PCM Code Status: Full Family Communication: None  Medical decision making: What exists of the patient's previous chart was reviewed in depth and the case was discussed with Dr. Claudine Mouton. Patient seen 6:43 AM on 09/04/2015.  Disposition Plan:  Admit to med surg inpatient for GIB, suspected lower source.  Consult to GI and Palliative Care medicine.      Edwin Dada [facility] Pager [phone]

## 2015-09-04 NOTE — ED Notes (Signed)
2 units blood ready.

## 2015-09-05 ENCOUNTER — Encounter (HOSPITAL_COMMUNITY): Payer: Self-pay | Admitting: *Deleted

## 2015-09-05 ENCOUNTER — Encounter (HOSPITAL_COMMUNITY)
Admission: EM | Disposition: A | Discharge status: Home Health | Payer: Self-pay | Source: Home / Self Care | Attending: Internal Medicine

## 2015-09-05 HISTORY — PX: COLONOSCOPY: SHX5424

## 2015-09-05 HISTORY — PX: ESOPHAGOGASTRODUODENOSCOPY: SHX5428

## 2015-09-05 LAB — TYPE AND SCREEN
ABO/RH(D): A POS
Antibody Screen: NEGATIVE
Unit division: 0
Unit division: 0

## 2015-09-05 LAB — CBC
HCT: 26.6 % — ABNORMAL LOW (ref 36.0–46.0)
Hemoglobin: 8.7 g/dL — ABNORMAL LOW (ref 12.0–15.0)
MCH: 31.1 pg (ref 26.0–34.0)
MCHC: 32.7 g/dL (ref 30.0–36.0)
MCV: 95 fL (ref 78.0–100.0)
PLATELETS: 172 10*3/uL (ref 150–400)
RBC: 2.8 MIL/uL — ABNORMAL LOW (ref 3.87–5.11)
RDW: 18.5 % — AB (ref 11.5–15.5)
WBC: 9 10*3/uL (ref 4.0–10.5)

## 2015-09-05 LAB — COMPREHENSIVE METABOLIC PANEL
ALK PHOS: 80 U/L (ref 38–126)
ALT: 13 U/L — AB (ref 14–54)
AST: 28 U/L (ref 15–41)
Albumin: 2.8 g/dL — ABNORMAL LOW (ref 3.5–5.0)
Anion gap: 8 (ref 5–15)
BUN: 55 mg/dL — AB (ref 6–20)
CALCIUM: 8.6 mg/dL — AB (ref 8.9–10.3)
CHLORIDE: 116 mmol/L — AB (ref 101–111)
CO2: 19 mmol/L — ABNORMAL LOW (ref 22–32)
CREATININE: 1.34 mg/dL — AB (ref 0.44–1.00)
GFR calc Af Amer: 39 mL/min — ABNORMAL LOW (ref >60)
GFR, EST NON AFRICAN AMERICAN: 34 mL/min — AB (ref >60)
Glucose, Bld: 77 mg/dL (ref 65–99)
Potassium: 3.6 mmol/L (ref 3.5–5.1)
Sodium: 143 mmol/L (ref 135–145)
Total Bilirubin: 0.9 mg/dL (ref 0.3–1.2)
Total Protein: 5.9 g/dL — ABNORMAL LOW (ref 6.5–8.1)

## 2015-09-05 SURGERY — COLONOSCOPY
Anesthesia: Moderate Sedation

## 2015-09-05 MED ORDER — MIDAZOLAM HCL 5 MG/ML IJ SOLN
INTRAMUSCULAR | Status: AC
  Filled 2015-09-05: qty 2

## 2015-09-05 MED ORDER — PANTOPRAZOLE SODIUM 40 MG PO TBEC: 40.0000 mg | DELAYED_RELEASE_TABLET | Freq: Every day | ORAL | Status: AC

## 2015-09-05 MED ORDER — FENTANYL CITRATE (PF) 100 MCG/2ML IJ SOLN
INTRAMUSCULAR | Status: DC | PRN
  Administered 2015-09-05 (×2): 25 ug via INTRAVENOUS

## 2015-09-05 MED ORDER — DIPHENHYDRAMINE HCL 50 MG/ML IJ SOLN
INTRAMUSCULAR | Status: AC
  Filled 2015-09-05: qty 1

## 2015-09-05 MED ORDER — SODIUM CHLORIDE 0.9 % IV SOLN
INTRAVENOUS | Status: DC
  Administered 2015-09-05: 500 mL via INTRAVENOUS

## 2015-09-05 MED ORDER — FENTANYL CITRATE (PF) 100 MCG/2ML IJ SOLN
INTRAMUSCULAR | Status: AC
  Filled 2015-09-05: qty 2

## 2015-09-05 MED ORDER — FERROUS SULFATE 325 (65 FE) MG PO TBEC: 325.0000 mg | DELAYED_RELEASE_TABLET | Freq: Two times a day (BID) | ORAL | Status: AC

## 2015-09-05 MED ORDER — MIDAZOLAM HCL 10 MG/2ML IJ SOLN
INTRAMUSCULAR | Status: DC | PRN
  Administered 2015-09-05 (×2): 1 mg via INTRAVENOUS
  Administered 2015-09-05: 2 mg via INTRAVENOUS
  Administered 2015-09-05: 1 mg via INTRAVENOUS

## 2015-09-05 NOTE — H&P (View-Only) (Signed)
Reason for Consult: Anemia, melena, and heme positive stool Referring Physician: [facility]  Shaleta Lonzo HPI: This is an 82 year old female with a PMH of CAD s/p CABG, CKD, AAA, hypothyroidism, and HTN admitted for melenic stools.  She started to have melena last week and she reports 3 black stools that was associated with dizziness.  She currently has melena.  The patient had a similar presentation in 05/2015 and she underwent further evaluation with Dr. Hilarie Fredrickson as an unassigned patient.  The EGD was negative for any bleeding source and a colonoscopy was recommended, but she declined.  The patient states that she had two prior colonoscopies with Dr. Odie Sera in [place] and she was told that she did not need any repeat screening procedures.  At that time, in 05/2015, she was taking Plavix, but this was discontinued.  Past Medical History  Diagnosis Date  . Anemia   . Arthritis   . CKD (chronic kidney disease)   . Cancer ([place])   . Fall     Syncope secondary to low BP.  Injured back.   . Hyperlipidemia   . Hypertension   . Osteoporosis   . Hypothyroid   . AAA (abdominal aortic aneurysm) ([place])   . PVD (peripheral vascular disease) [facility])     Past Surgical History  Procedure Laterality Date  . Appendectomy    . Coronary artery bypass graft      2000  . Skin cancer resected    . Breast biopsy    . Coronary angioplasty with stent placement  2004    BMS x 2 to LIMA-LAD  . Esophagogastroduodenoscopy N/A 05/04/2015    Procedure: ESOPHAGOGASTRODUODENOSCOPY (EGD);  Surgeon: Jerene Bears, MD;  Location: [facility] ENDOSCOPY;  Service: Endoscopy;  Laterality: N/A;    Family History  Problem Relation Age of Onset  . CAD Mother   . CAD Father     Social History:  reports that she has quit smoking. She does not have any smokeless tobacco history on file. She reports that she does not drink alcohol or use illicit drugs.  Allergies:  Allergies  Allergen Reactions  . Contrast Media  [Iodinated Diagnostic Agents] Itching  . Amlodipine Besylate Other (See Comments)    unknown  . Aspirin Swelling  . Cefdinir Nausea And Vomiting  . Cephalexin Nausea And Vomiting  . Chocolate Nausea And Vomiting    Migraines  . Codeine Nausea And Vomiting and Other (See Comments)    dizziness  . Doxycycline Hyclate Nausea And Vomiting  . Erythromycin Nausea And Vomiting  . Levofloxacin Nausea And Vomiting  . Mometasone Other (See Comments)    Could not stand the odor  . Mucinex [Guaifenesin Er] Other (See Comments)    Paradoxical response  . Other Other (See Comments)    Oregano (swelling and bloating)  . Peanut-Containing Drug Products Nausea And Vomiting  . Progesterone Swelling and Other (See Comments)    Bloated  . Propoxyphene N-Acetaminophen Other (See Comments)    Dizziness, made her feel drunk  . Valsartan Nausea Only  . Tape Itching and Rash    Please use "paper" tape.    Medications:  Scheduled: . allopurinol  200 mg Oral Daily  . atorvastatin  40 mg Oral QPM  . ezetimibe  10 mg Oral Daily  . levothyroxine  75 mcg Oral QAC breakfast  . losartan  50 mg Oral Daily  . pantoprazole  40 mg Oral Daily   Continuous: . sodium chloride 75 mL/hr at  09/04/15 1330    Results for orders placed or performed during the hospital encounter of 09/03/15 (from the past 24 hour(s))  Comprehensive metabolic panel     Status: Abnormal   Collection Time: 09/03/15  6:01 PM  Result Value Ref Range   Sodium 138 135 - 145 mmol/L   Potassium 4.7 3.5 - 5.1 mmol/L   Chloride 108 101 - 111 mmol/L   CO2 20 (L) 22 - 32 mmol/L   Glucose, Bld 107 (H) 65 - 99 mg/dL   BUN 93 (H) 6 - 20 mg/dL   Creatinine, Ser 1.88 (H) 0.44 - 1.00 mg/dL   Calcium 9.0 8.9 - 10.3 mg/dL   Total Protein 7.3 6.5 - 8.1 g/dL   Albumin 3.4 (L) 3.5 - 5.0 g/dL   AST 32 15 - 41 U/L   ALT 18 14 - 54 U/L   Alkaline Phosphatase 100 38 - 126 U/L   Total Bilirubin 0.2 (L) 0.3 - 1.2 mg/dL   GFR calc non Af Amer 23 (L)  >60 mL/min   GFR calc Af Amer 26 (L) >60 mL/min   Anion gap 10 5 - 15  CBC     Status: Abnormal   Collection Time: 09/03/15  6:01 PM  Result Value Ref Range   WBC 8.6 4.0 - 10.5 K/uL   RBC 2.18 (L) 3.87 - 5.11 MIL/uL   Hemoglobin 6.8 (LL) 12.0 - 15.0 g/dL   HCT 22.0 (L) 36.0 - 46.0 %   MCV 100.9 (H) 78.0 - 100.0 fL   MCH 31.2 26.0 - 34.0 pg   MCHC 30.9 30.0 - 36.0 g/dL   RDW 16.1 (H) 11.5 - 15.5 %   Platelets 236 150 - 400 K/uL  Type and screen [place]     Status: None (Preliminary result)   Collection Time: 09/03/15  6:01 PM  Result Value Ref Range   ABO/RH(D) A POS    Antibody Screen NEG    Sample Expiration 09/06/2015    Unit Number [id number]    Blood Component Type RED CELLS,LR    Unit division 00    Status of Unit ISSUED    Transfusion Status OK TO TRANSFUSE    Crossmatch Result Compatible    Unit Number [id number]    Blood Component Type RED CELLS,LR    Unit division 00    Status of Unit ISSUED    Transfusion Status OK TO TRANSFUSE    Crossmatch Result Compatible   Differential     Status: None   Collection Time: 09/03/15  6:01 PM  Result Value Ref Range   Neutrophils Relative % 59 %   Neutro Abs 4.9 1.7 - 7.7 K/uL   Lymphocytes Relative 28 %   Lymphs Abs 2.3 0.7 - 4.0 K/uL   Monocytes Relative 7 %   Monocytes Absolute 0.6 0.1 - 1.0 K/uL   Eosinophils Relative 6 %   Eosinophils Absolute 0.5 0.0 - 0.7 K/uL   Basophils Relative 0 %   Basophils Absolute 0.0 0.0 - 0.1 K/uL  POC occult blood, ED     Status: Abnormal   Collection Time: 09/04/15 12:53 AM  Result Value Ref Range   Fecal Occult Bld POSITIVE (A) NEGATIVE  CBC with Differential/Platelet     Status: Abnormal   Collection Time: 09/04/15 12:58 AM  Result Value Ref Range   WBC 8.5 4.0 - 10.5 K/uL   RBC 2.04 (L) 3.87 - 5.11 MIL/uL   Hemoglobin 6.6 (LL) 12.0 - 15.0  g/dL   HCT 20.6 (L) 36.0 - 46.0 %   MCV 101.0 (H) 78.0 - 100.0 fL   MCH 32.4 26.0 - 34.0 pg   MCHC 32.0 30.0 -  36.0 g/dL   RDW 16.0 (H) 11.5 - 15.5 %   Platelets 200 150 - 400 K/uL   Neutrophils Relative % 57 %   Neutro Abs 4.9 1.7 - 7.7 K/uL   Lymphocytes Relative 31 %   Lymphs Abs 2.6 0.7 - 4.0 K/uL   Monocytes Relative 6 %   Monocytes Absolute 0.5 0.1 - 1.0 K/uL   Eosinophils Relative 6 %   Eosinophils Absolute 0.5 0.0 - 0.7 K/uL   Basophils Relative 0 %   Basophils Absolute 0.0 0.0 - 0.1 K/uL  Prepare RBC     Status: None   Collection Time: 09/04/15  1:30 AM  Result Value Ref Range   Order Confirmation ORDER PROCESSED BY BLOOD BANK      No results found.  ROS:  As stated above in the HPI otherwise negative.  Blood pressure 163/48, pulse 72, temperature 97.9 F (36.6 C), temperature source Oral, resp. rate 20, SpO2 98 %.    PE: Gen: NAD, Alert and Oriented HEENT:  [place]/AT, EOMI Neck: Supple, no LAD Lungs: CTA Bilaterally CV: RRR without M/G/R ABM: Soft, NTND, +BS Ext: No C/C/E  Assessment/Plan: 1) Melena. 2) Anemia. 3) Heme positive stool.   She is agreeable to the colonoscopy.  I will pursue the lower first since she had a relatively recent EGD.  If the colonoscopy is negative, I will perform the EGD at the same time.  Plan: 1) Colonoscopy +/- EGD.  Yonis Carreon D 09/04/2015, 5:01 PM

## 2015-09-05 NOTE — Clinical Documentation Improvement (Signed)
  Hospitalist  Would you please help clarify the specific medical condition related to the clinical findings?   Acute Renal Failure/Acute Kidney Injury  Acute Tubular Necrosis  Acute Renal Cortical Necrosis  Acute Renal Medullary Necrosis  Acute on Chronic Renal Failure  Chronic Renal Failure  Other  Clinically Undetermined  Supporting Information: On admission BUN is 55; creatinine is 1.34; GFR is 34.    Please exercise your independent, professional judgment when responding. A specific answer is not anticipated or expected.   Thank You,  [place] [phone]

## 2015-09-05 NOTE — Op Note (Signed)
[facility] [place], [id number]   ENDOSCOPY PROCEDURE REPORT  PATIENT: [name], [name]  MR#: [id number] BIRTHDATE: [birth year] , 89  yrs. old GENDER: female ENDOSCOPIST:Dewight Catino Benson Norway, MD REFERRED BY: PROCEDURE DATE:  Oct 03, 2015 PROCEDURE:   EGD w/ biopsy ASA CLASS:    Class III INDICATIONS: melena and iron deficiency anemia. MEDICATION: Versed 5 mg IV and Fentanyl 50 mcg IV TOPICAL ANESTHETIC:   none  DESCRIPTION OF PROCEDURE:   After the risks and benefits of the procedure were explained, informed consent was obtained.  The endoscope was introduced through the mouth  and advanced to the second portion of the duodenum .  The instrument was slowly withdrawn as the mucosa was fully examined. Estimated blood loss is zero unless otherwise noted in this procedure report.    FINDINGS: In the distal esophagus a nonobstructive Schatzki's ring was found in the setting of a 1-2 cm hiatal hernia.  The gastric lumen was normal.  The duodenal mucosa revealed some mild villous blunting beyond the duodenal bulb.  The image of the duodenum did not capture.  Cold biopsies  were obtained to evaluate for Celiac disease.    Retroflexed views revealed no abnormalities.    The scope was then withdrawn from the patient and the procedure completed.  COMPLICATIONS: There were no immediate complications.  ENDOSCOPIC IMPRESSION: 1) Nonobstructive Schatzki's ring. 2) Small hiatal hernia. 3) ? distal duodenal villous blunting.  RECOMMENDATIONS: 1) Follow up biopsy results.   _______________________________ eSignedCarol Ada, MD 2015-10-03 3:04 PM     cc:  CPT CODES: ICD CODES:  The ICD and CPT codes recommended by this software are interpretations from the data that the clinical staff has captured with the software.  The verification of the translation of this report to the ICD and CPT codes and modifiers is the sole responsibility of the  health care institution and practicing physician where this report was generated.  [employer] will not be held responsible for the validity of the ICD and CPT codes included on this report.  AMA assumes no liability for data contained or not contained herein. C[employer] is a Designer, television/film set of the [employer].

## 2015-09-05 NOTE — Discharge Summary (Addendum)
Physician Discharge Summary  Adah Stoneberg MRN: [id number] DOB/AGE: July 10, 1924 82 y.o.  PCP: Nicoletta Dress, MD   Admit date: 09/03/2015 Discharge date: 09/05/2015  Discharge Diagnoses:   Principal Problem:   Acute blood loss anemia Active Problems:   Hypothyroidism   Essential hypertension   CAD, ARTERY BYPASS GRAFT   Melena   GIB (gastrointestinal bleeding)   Palliative care encounter    Follow-up recommendations Follow-up with PCP in 3-5 days , including all  additional recommended appointments as below Follow-up CBC, CMP in 3-5 days  patient is is followed by hospice at home May need home health      Medication List    STOP taking these medications        famotidine 10 MG tablet  Commonly known as:  PEPCID     furosemide 40 MG tablet  Commonly known as:  LASIX      TAKE these medications        acetaminophen 500 MG tablet  Commonly known as:  TYLENOL  Take 500 mg by mouth daily as needed for moderate pain.     ACTONEL 35 MG tablet  Generic drug:  risedronate  Take 35 mg by mouth every Saturday.     allopurinol 100 MG tablet  Commonly known as:  ZYLOPRIM  Take 200 mg by mouth daily.     atorvastatin 40 MG tablet  Commonly known as:  LIPITOR  Take 40 mg by mouth every evening.     bisacodyl 5 MG EC tablet  Commonly known as:  DULCOLAX  Take 5 mg by mouth daily as needed for mild constipation or moderate constipation.     conjugated estrogens vaginal cream  Commonly known as:  PREMARIN  Place vaginally every Tuesday.     ezetimibe 10 MG tablet  Commonly known as:  ZETIA  Take 10 mg by mouth daily.     ferrous sulfate 325 (65 FE) MG EC tablet  Take 1 tablet (325 mg total) by mouth every 12 (twelve) hours.     hydroxypropyl methylcellulose / hypromellose 2.5 % ophthalmic solution  Commonly known as:  ISOPTO TEARS / GONIOVISC  Place 1 drop into both eyes daily as needed for dry eyes.     KLOR-CON M20 20 MEQ tablet  Generic drug:   potassium chloride SA  Take 20 mEq by mouth 2 (two) times daily.     levothyroxine 75 MCG tablet  Commonly known as:  SYNTHROID, LEVOTHROID  Take 75 mcg by mouth daily.     loratadine 10 MG tablet  Commonly known as:  CLARITIN  Take 10 mg by mouth daily.     losartan 25 MG tablet  Commonly known as:  COZAAR  Take 50 mg by mouth daily.     METAMUCIL PO  Take 1 capsule by mouth 3 (three) times daily.     pantoprazole 40 MG tablet  Commonly known as:  PROTONIX  Take 1 tablet (40 mg total) by mouth daily.     sodium chloride 0.65 % Soln nasal spray  Commonly known as:  OCEAN  Place 1 spray into both nostrils daily as needed for congestion.         Discharge Condition:    Discharge Instructions         Disposition: 01-Home or Self Care   Consults:   Gastroenterology    Significant Diagnostic Studies:  No results found.     Filed Weights   09/04/15 0700  Weight: 43.999 kg (97 lb)  Microbiology: No results found for this or any previous visit (from the past 240 hour(s)).     Blood Culture    Component Value Date/Time   SDES URINE, RANDOM 08/17/2009 1816   SPECREQUEST NONE 08/17/2009 1816   CULT INSIGNIFICANT GROWTH 08/17/2009 1816   REPTSTATUS 08/19/2009 FINAL 08/17/2009 1816      Labs: Results for orders placed or performed during the hospital encounter of 09/03/15 (from the past 48 hour(s))  Comprehensive metabolic panel     Status: Abnormal   Collection Time: 09/03/15  6:01 PM  Result Value Ref Range   Sodium 138 135 - 145 mmol/L   Potassium 4.7 3.5 - 5.1 mmol/L   Chloride 108 101 - 111 mmol/L   CO2 20 (L) 22 - 32 mmol/L   Glucose, Bld 107 (H) 65 - 99 mg/dL   BUN 93 (H) 6 - 20 mg/dL   Creatinine, Ser 1.88 (H) 0.44 - 1.00 mg/dL   Calcium 9.0 8.9 - 10.3 mg/dL   Total Protein 7.3 6.5 - 8.1 g/dL   Albumin 3.4 (L) 3.5 - 5.0 g/dL   AST 32 15 - 41 U/L   ALT 18 14 - 54 U/L   Alkaline Phosphatase 100 38 - 126 U/L   Total  Bilirubin 0.2 (L) 0.3 - 1.2 mg/dL   GFR calc non Af Amer 23 (L) >60 mL/min   GFR calc Af Amer 26 (L) >60 mL/min    Comment: (NOTE) The eGFR has been calculated using the CKD EPI equation. This calculation has not been validated in all clinical situations. eGFR's persistently <60 mL/min signify possible Chronic Kidney Disease.    Anion gap 10 5 - 15  CBC     Status: Abnormal   Collection Time: 09/03/15  6:01 PM  Result Value Ref Range   WBC 8.6 4.0 - 10.5 K/uL   RBC 2.18 (L) 3.87 - 5.11 MIL/uL   Hemoglobin 6.8 (LL) 12.0 - 15.0 g/dL    Comment: REPEATED TO VERIFY CRITICAL RESULT CALLED TO, READ BACK BY AND VERIFIED WITH: ROBY,A RN _0  BY GRINSTEAD,C 1.4.17    HCT 22.0 (L) 36.0 - 46.0 %   MCV 100.9 (H) 78.0 - 100.0 fL   MCH 31.2 26.0 - 34.0 pg   MCHC 30.9 30.0 - 36.0 g/dL   RDW 16.1 (H) 11.5 - 15.5 %   Platelets 236 150 - 400 K/uL  Type and screen [place]     Status: None   Collection Time: 09/03/15  6:01 PM  Result Value Ref Range   ABO/RH(D) A POS    Antibody Screen NEG    Sample Expiration 09/06/2015    Unit Number [id number]    Blood Component Type RED CELLS,LR    Unit division 00    Status of Unit ISSUED,FINAL    Transfusion Status OK TO TRANSFUSE    Crossmatch Result Compatible    Unit Number [id number]    Blood Component Type RED CELLS,LR    Unit division 00    Status of Unit ISSUED,FINAL    Transfusion Status OK TO TRANSFUSE    Crossmatch Result Compatible   Differential     Status: None   Collection Time: 09/03/15  6:01 PM  Result Value Ref Range   Neutrophils Relative % 59 %   Neutro Abs 4.9 1.7 - 7.7 K/uL   Lymphocytes Relative 28 %   Lymphs Abs 2.3 0.7 - 4.0 K/uL   Monocytes Relative 7 %   Monocytes Absolute 0.6  0.1 - 1.0 K/uL   Eosinophils Relative 6 %   Eosinophils Absolute 0.5 0.0 - 0.7 K/uL   Basophils Relative 0 %   Basophils Absolute 0.0 0.0 - 0.1 K/uL  POC occult blood, ED     Status: Abnormal   Collection Time:  09/04/15 12:53 AM  Result Value Ref Range   Fecal Occult Bld POSITIVE (A) NEGATIVE  CBC with Differential/Platelet     Status: Abnormal   Collection Time: 09/04/15 12:58 AM  Result Value Ref Range   WBC 8.5 4.0 - 10.5 K/uL   RBC 2.04 (L) 3.87 - 5.11 MIL/uL   Hemoglobin 6.6 (LL) 12.0 - 15.0 g/dL    Comment: REPEATED TO VERIFY CRITICAL VALUE NOTED.  VALUE IS CONSISTENT WITH PREVIOUSLY REPORTED AND CALLED VALUE.    HCT 20.6 (L) 36.0 - 46.0 %   MCV 101.0 (H) 78.0 - 100.0 fL   MCH 32.4 26.0 - 34.0 pg   MCHC 32.0 30.0 - 36.0 g/dL   RDW 16.0 (H) 11.5 - 15.5 %   Platelets 200 150 - 400 K/uL   Neutrophils Relative % 57 %   Neutro Abs 4.9 1.7 - 7.7 K/uL   Lymphocytes Relative 31 %   Lymphs Abs 2.6 0.7 - 4.0 K/uL   Monocytes Relative 6 %   Monocytes Absolute 0.5 0.1 - 1.0 K/uL   Eosinophils Relative 6 %   Eosinophils Absolute 0.5 0.0 - 0.7 K/uL   Basophils Relative 0 %   Basophils Absolute 0.0 0.0 - 0.1 K/uL  Prepare RBC     Status: None   Collection Time: 09/04/15  1:30 AM  Result Value Ref Range   Order Confirmation ORDER PROCESSED BY BLOOD BANK   Comprehensive metabolic panel     Status: Abnormal   Collection Time: 09/05/15  8:02 AM  Result Value Ref Range   Sodium 143 135 - 145 mmol/L   Potassium 3.6 3.5 - 5.1 mmol/L   Chloride 116 (H) 101 - 111 mmol/L   CO2 19 (L) 22 - 32 mmol/L   Glucose, Bld 77 65 - 99 mg/dL   BUN 55 (H) 6 - 20 mg/dL   Creatinine, Ser 1.34 (H) 0.44 - 1.00 mg/dL   Calcium 8.6 (L) 8.9 - 10.3 mg/dL   Total Protein 5.9 (L) 6.5 - 8.1 g/dL   Albumin 2.8 (L) 3.5 - 5.0 g/dL   AST 28 15 - 41 U/L   ALT 13 (L) 14 - 54 U/L   Alkaline Phosphatase 80 38 - 126 U/L   Total Bilirubin 0.9 0.3 - 1.2 mg/dL   GFR calc non Af Amer 34 (L) >60 mL/min   GFR calc Af Amer 39 (L) >60 mL/min    Comment: (NOTE) The eGFR has been calculated using the CKD EPI equation. This calculation has not been validated in all clinical situations. eGFR's persistently <60 mL/min signify  possible Chronic Kidney Disease.    Anion gap 8 5 - 15  CBC     Status: Abnormal   Collection Time: 09/05/15  8:02 AM  Result Value Ref Range   WBC 9.0 4.0 - 10.5 K/uL   RBC 2.80 (L) 3.87 - 5.11 MIL/uL   Hemoglobin 8.7 (L) 12.0 - 15.0 g/dL    Comment: POST TRANSFUSION SPECIMEN   HCT 26.6 (L) 36.0 - 46.0 %   MCV 95.0 78.0 - 100.0 fL   MCH 31.1 26.0 - 34.0 pg   MCHC 32.7 30.0 - 36.0 g/dL   RDW 18.5 (H) 11.5 -  15.5 %   Platelets 172 150 - 400 K/uL     Lipid Panel  No results found for: CHOL, TRIG, HDL, CHOLHDL, VLDL, LDLCALC, LDLDIRECT   Lab Results  Component Value Date   HGBA1C 5.4 05/03/2015     Lab Results  Component Value Date   CREATININE 1.34* 09/05/2015     HPI :82 year old female with a PMH of CAD s/p CABG, CKD, AAA, hypothyroidism, and HTN admitted for melenic stools. She started to have melena last week and she reports 3 black stools that was associated with dizziness. She currently has melena. The patient had a similar presentation in 05/2015 and she underwent further evaluation with Dr. Hilarie Fredrickson as an unassigned patient. The EGD was negative for any bleeding source and a colonoscopy was recommended, but she declined. The patient states that she had two prior colonoscopies with Dr. Odie Sera in [place] and she was told that she did not need any repeat screening procedures. At that time, in 05/2015, she was taking Plavix, but this was discontinued.  HOSPITAL COURSE:  GI bleeding Presented with 3 day melanotic stools, hemoglobin 6.8, receiving 2 units of packed red blood cells, now 8.7 Prior EGD showed 05/04/15 no evidence of bleeding. Patient refused colonoscopy, GI recommended to continue to hold Plavix, last stent she had about 16 years ago. Follow CBC GI consultation,  Dr Benson Norway, She is agreeable to the colonoscopy. I will pursue the lower first since she had a relatively recent EGD Apparently on hospice at home , came back for transfusion dependent anemia   Anticipate discharge home today with home health vs  home hospice, patient is a DO NOT RESUSCITATE, anxious to go home and very tearful,worried about her husband   EGD IMPRESSION: 1) Nonobstructive Schatzki's ring. 2) Small hiatal hernia. 3) ? distal duodenal villous blunting  Colonoscopy IMPRESSION: 1) Cecal AVMs s/p APC. Probable sites of bleeding. 2) Colonic polyp.  Dc home in am if HG stable    Acute blood loss anemia Patient has acute blood loss anemia on top of anemia of chronic disease, reported she was getting Aranesp periodically as. Patient appears to have chronic anemia secondary to CKD stage 3-4. Check hemoglobin every 12 hours, transfuse if hemoglobin less than 8.0, patient has history of CAD. Patient   had 2 units of packed RBCs.   Essential hypertension Hold antihypertensives medications, because of concurrent bleed.  Hypothyroidism continue Synthroid dose.  Acute kidney injury in the setting off Stage III-IV CKD Patient has stage III to stage IV CKD, per Dr. Delena Bali' notes creatinine was 1.7 back in March 2016. Patient creatinine 1.88 >1.34      Discharge Exam:    Blood pressure 175/50, pulse 81, temperature 98 F (36.7 C), temperature source Oral, resp. rate 18, weight 43.999 kg (97 lb), SpO2 96 %.  General appearance: Frail elderly female, alert and in no acute distress.  Eyes: Anicteric, conjunctiva pink, lids and lashes normal.  ENT: No nasal deformity, discharge, or epistaxis. OP moist without lesions.  Skin: Warm and dry. No jaundice or pallor. Cardiac: RRR, nl K5-L9, loud systolic murmur. No LE edema. Radial pulses 2+ and symmetric. Respiratory: Normal respiratory rate and rhythm. CTAB without rales or wheezes. Abdomen: Abdomen soft without rigidity. No TTP.Ventral hernia, that is without induration or redness or pain.  MSK: Finger deformed by osteoarthritis. Vertebral tenderness moderate at T4 level. Paraspinal  tenderness throughout upper back and trapezius tendernes bilaterally. Neuro: Sensorium intact and responding to questions, attention normal. Speech  is fluent. Moves all extremities equally and with normal coordination.  Psych: Behavior appropriate. Affect anxious. No evidence of aural or visual hallucinations or delusions.         Follow-up Information    Follow up with SCHULTZ,DOUGLAS E, MD. Schedule an appointment as soon as possible for a visit in 3 days.   Specialty:  Internal Medicine   Contact information:   [address] [place] [address] [phone]       Signed: Reyne Dumas 09/05/2015, 1:15 PM        Time spent >45 mins

## 2015-09-05 NOTE — Progress Notes (Signed)
PT Cancellation Note  Patient Details Name: [name] MRN: [id number] DOB: 1924-07-27   Cancelled Treatment:    Reason Eval/Treat Not Completed: Medical issues which prohibited therapy (Pt still sedated after procedure.) Will follow up tomorrow.   Dianne Whelchel 09/05/2015, 5:10 PM [place]

## 2015-09-05 NOTE — Op Note (Signed)
[facility] [place], [address]   COLONOSCOPY PROCEDURE REPORT  PATIENT: [name], [name]  MR#: [id number] BIRTHDATE: Jun 16, 1924 , 89  yrs. old GENDER: female ENDOSCOPIST: Carol Ada, MD REFERRED BY: PROCEDURE DATE:  09-21-15 PROCEDURE:   Colonoscopy with control of bleeding ASA CLASS:   Class III INDICATIONS: Melena and anemia MEDICATIONS: See the EGD report.  DESCRIPTION OF PROCEDURE:   After the risks and benefits and of the procedure were explained, informed consent was obtained.  revealed no abnormalities of the rectum.    The Pentax Ped Colon [id number] endoscope was introduced through the anus and advanced to the terminal ileum which was intubated for a short distance .  The quality of the prep was excellent. .  The instrument was then slowly withdrawn as the colon was fully examined. Estimated blood loss is zero unless otherwise noted in this procedure report.   FINDINGS: Upon initial entry into the colon residual clear liquid was encountered with a red tinge.  This became more prominent as the colonoscope was advanced proximally.  The Terminal Ileum was normal.  No evidence of any bleeding dource.  In the cecum two small vascular lesions were identified (Image 004).  One of the lesions was noted be have some scant oozing.  Both lesions were ablated wtih APC.  Two larger typical nonbleeding AVMs were found in the cecum (Images 002 and 007) and ablated with APC.  A 3 mm sessile ascending colon polyp was removed with a cold snare.  No other abnormalities were found..            The scope was then withdrawn from the patient and the procedure completed.  WITHDRAWAL TIME: 18 minutes  COMPLICATIONS: There were no immediate complications. ENDOSCOPIC IMPRESSION: 1) Cecal AVMs s/p APC.  Probable sites of bleeding. 2) Colonic polyp.  RECOMMENDATIONS: 1) Follow up biopsies.  No repeat colonoscopy for polyp  surveillance recommended give her age and the current findings. 2) If HGB is stable tomorrow she can be discharged home.  REPEAT EXAM:  cc:  _______________________________ eSignedCarol Ada, MD 09-21-2015 3:11 PM   CPT CODES: ICD CODES:  The ICD and CPT codes recommended by this software are interpretations from the data that the clinical staff has captured with the software.  The verification of the translation of this report to the ICD and CPT codes and modifiers is the sole responsibility of the health care institution and practicing physician where this report was generated.  [facility]. will not be held responsible for the validity of the ICD and CPT codes included on this report.  AMA assumes no liability for data contained or not contained herein. CPT is a Designer, television/film set of the [employer].   PATIENT NAME:  [name], [name] MR#: [id number]

## 2015-09-05 NOTE — Progress Notes (Signed)
[name] is anticipating her colonoscopy when I come to visit. Her pastor, daughter, son in law are at bedside. They anticipate home with hospice when stable. They have no questions or concerns. [name] is glad to be done with the prep for colonoscopy and says this was horrible. She looks forward to returning home with her husband of 63 yrs. Support provided.   Vinie Sill, NP Palliative Medicine Team Pager # [phone] (M-F 8a-5p) Team Phone # [phone] (Nights/Weekends)

## 2015-09-05 NOTE — Interval H&P Note (Signed)
History and Physical Interval Note:  09/05/2015 2:08 PM  [name]  has presented today for surgery, with the diagnosis of Heme positive stool, melena, and anemia  The various methods of treatment have been discussed with the patient and family. After consideration of risks, benefits and other options for treatment, the patient has consented to  Procedure(s): COLONOSCOPY (N/A) ESOPHAGOGASTRODUODENOSCOPY (EGD) (N/A) as a surgical intervention .  The patient's history has been reviewed, patient examined, no change in status, stable for surgery.  I have reviewed the patient's chart and labs.  Questions were answered to the patient's satisfaction.     Keelee Yankey D

## 2015-09-06 DIAGNOSIS — K5521 Angiodysplasia of colon with hemorrhage: Principal | ICD-10-CM

## 2015-09-06 LAB — CBC
HCT: 26 % — ABNORMAL LOW (ref 36.0–46.0)
HEMOGLOBIN: 8.4 g/dL — AB (ref 12.0–15.0)
MCH: 31.3 pg (ref 26.0–34.0)
MCHC: 32.3 g/dL (ref 30.0–36.0)
MCV: 97 fL (ref 78.0–100.0)
Platelets: 177 10*3/uL (ref 150–400)
RBC: 2.68 MIL/uL — ABNORMAL LOW (ref 3.87–5.11)
RDW: 18.5 % — ABNORMAL HIGH (ref 11.5–15.5)
WBC: 8.6 10*3/uL (ref 4.0–10.5)

## 2015-09-06 LAB — BASIC METABOLIC PANEL
ANION GAP: 7 (ref 5–15)
BUN: 45 mg/dL — ABNORMAL HIGH (ref 6–20)
CHLORIDE: 116 mmol/L — AB (ref 101–111)
CO2: 21 mmol/L — ABNORMAL LOW (ref 22–32)
CREATININE: 1.52 mg/dL — AB (ref 0.44–1.00)
Calcium: 8.3 mg/dL — ABNORMAL LOW (ref 8.9–10.3)
GFR calc non Af Amer: 29 mL/min — ABNORMAL LOW (ref >60)
GFR, EST AFRICAN AMERICAN: 34 mL/min — AB (ref >60)
Glucose, Bld: 105 mg/dL — ABNORMAL HIGH (ref 65–99)
POTASSIUM: 3.8 mmol/L (ref 3.5–5.1)
SODIUM: 144 mmol/L (ref 135–145)

## 2015-09-06 NOTE — Care Management Note (Signed)
Case Management Note  Patient Details  Name: [name] MRN: [id number] Date of Birth: 07-05-1924  Subjective/Objective:                    Action/Plan: CM spoke with patient. She agrees with her daughter to set-up HHPT, OT and HHA with [facility]. She does not want to be discharged today due to the weather. [name] at [facility] notified of the referral for home health services and expected d/c date of today. Will fax orders, H&P and demographics to [phone].   Expected Discharge Date:    09/06/15              Expected Discharge Plan:  [place]  In-House Referral:     Discharge planning Services  CM Consult  Post Acute Care Choice:  Home Health Choice offered to:  Patient, Adult Children  DME Arranged:  N/A DME Agency:  NA  HH Arranged:  PT, OT, Nurse's Aide [place] Agency:  [facility]  Status of Service:  Completed, signed off  Medicare Important Message Given:    Date Medicare IM Given:    Medicare IM give by:    Date Additional Medicare IM Given:    Additional Medicare Important Message give by:     If discussed at [place] of Stay Meetings, dates discussed:    Additional Comments:  Apolonio Schneiders, RN 09/06/2015, 12:03 PM

## 2015-09-06 NOTE — Care Management (Signed)
CM contacted patient's daughter Blanch Media. Informed her mother will be seen by Dr. Karleen Hampshire and will be evaluated by GI today to determine if she is medically stable for discharge. Discussed setting up hospice vs home health. She selected [facility] for HHPT, OT and HHA after discussing the differences. States she will have to go to her mother's home today to care or her if she is discharged home today. She does not feel that she can care for herself today without assistance. Presenter, broadcasting BSN CCM

## 2015-09-06 NOTE — Progress Notes (Signed)
Nsg Discharge Note  Admit Date:  09/03/2015 Discharge date: 09/06/2015   [name] to be D/C'd Home per MD order.  AVS completed.  Copy for chart, and copy for patient signed, and dated. Patient/caregiver able to verbalize understanding.  Discharge Medication:   Medication List    STOP taking these medications        famotidine 10 MG tablet  Commonly known as:  PEPCID     furosemide 40 MG tablet  Commonly known as:  LASIX      TAKE these medications        acetaminophen 500 MG tablet  Commonly known as:  TYLENOL  Take 500 mg by mouth daily as needed for moderate pain.     ACTONEL 35 MG tablet  Generic drug:  risedronate  Take 35 mg by mouth every Saturday.     allopurinol 100 MG tablet  Commonly known as:  ZYLOPRIM  Take 200 mg by mouth daily.     atorvastatin 40 MG tablet  Commonly known as:  LIPITOR  Take 40 mg by mouth every evening.     bisacodyl 5 MG EC tablet  Commonly known as:  DULCOLAX  Take 5 mg by mouth daily as needed for mild constipation or moderate constipation.     conjugated estrogens vaginal cream  Commonly known as:  PREMARIN  Place vaginally every Tuesday.     ezetimibe 10 MG tablet  Commonly known as:  ZETIA  Take 10 mg by mouth daily.     ferrous sulfate 325 (65 FE) MG EC tablet  Take 1 tablet (325 mg total) by mouth every 12 (twelve) hours.     hydroxypropyl methylcellulose / hypromellose 2.5 % ophthalmic solution  Commonly known as:  ISOPTO TEARS / GONIOVISC  Place 1 drop into both eyes daily as needed for dry eyes.     KLOR-CON M20 20 MEQ tablet  Generic drug:  potassium chloride SA  Take 20 mEq by mouth 2 (two) times daily.     levothyroxine 75 MCG tablet  Commonly known as:  SYNTHROID, LEVOTHROID  Take 75 mcg by mouth daily.     loratadine 10 MG tablet  Commonly known as:  CLARITIN  Take 10 mg by mouth daily.     losartan 25 MG tablet  Commonly known as:  COZAAR  Take 50 mg by mouth daily.     METAMUCIL PO  Take  1 capsule by mouth 3 (three) times daily.     pantoprazole 40 MG tablet  Commonly known as:  PROTONIX  Take 1 tablet (40 mg total) by mouth daily.     sodium chloride 0.65 % Soln nasal spray  Commonly known as:  OCEAN  Place 1 spray into both nostrils daily as needed for congestion.        Discharge Assessment: Filed Vitals:   09/06/15 0520 09/06/15 1417  BP: 136/39 169/67  Pulse: 80 72  Temp: 98.9 F (37.2 C) 98.3 F (36.8 C)  Resp: 16 20   Skin clean, dry and intact without evidence of skin break down, no evidence of skin tears noted. IV catheter discontinued intact. Site without signs and symptoms of complications - no redness or edema noted at insertion site, patient denies c/o pain - only slight tenderness at site.  Dressing with slight pressure applied.  D/c Instructions-Education: Discharge instructions given to patient/family with verbalized understanding. D/c education completed with patient/family including follow up instructions, medication list, d/c activities limitations if indicated, with other d/c  instructions as indicated by MD - patient able to verbalize understanding, all questions fully answered. Patient instructed to return to ED, call 911, or call MD for any changes in condition.  Patient escorted via [place], and D/C home via private auto.  [name] Margaretha Sheffield, RN 09/06/2015 4:25 PM

## 2015-09-06 NOTE — Progress Notes (Signed)
Daily Rounding Note  09/06/2015, 11:20 AM  LOS: 2 days    For Dr Benson Norway her primary GI is [facility] in [place].   SUBJECTIVE:       Asked by Dr Karleen Hampshire to weigh in on if ok for pt to return home.   Pt had 1/6 EGD, negative, and 1/6 colonoscopy, with APC of cecal AVMs and removal of ascending sessile polyp.  No BMs since her prep.  Tolerating solid foods  Pt plans to return back to her home with hospice assistance (her husband has home hospice too). Apparently dtr, Blanch Media,  reluctant for pt to return home. Dr Karleen Hampshire wanted GI to assure pt and her dtr that she was ok to discharge from GI perspective.     OBJECTIVE:         Vital signs in last 24 hours:    Temp:  [97.6 F (36.4 C)-98.9 F (37.2 C)] 98.9 F (37.2 C) (01/07 0520) Pulse Rate:  [63-86] 80 (01/07 0520) Resp:  [15-22] 16 (01/07 0520) BP: (95-212)/(29-81) 136/39 mmHg (01/07 0520) SpO2:  [82 %-100 %] 94 % (01/07 0520) Last BM Date: 09/04/15 Filed Weights   09/04/15 0700  Weight: 43.999 kg (97 lb)   General: looks frail, alert, comfortable   Heart: RRR Chest: clear bil Abdomen: soft, NT, ND.  Active BS  Extremities: no CCE Neuro/Psych:  Oriented x 3.    Intake/Output from previous day: 01/06 0701 - 01/07 0700 In: 480 [P.O.:480] Out: 150 [Urine:150]  Intake/Output this shift: Total I/O In: 240 [P.O.:240] Out: -   Lab Results:  Recent Labs  09/04/15 0058 09/05/15 0802 09/06/15 0545  WBC 8.5 9.0 8.6  HGB 6.6* 8.7* 8.4*  HCT 20.6* 26.6* 26.0*  PLT 200 172 177   BMET  Recent Labs  09/03/15 1801 09/05/15 0802 09/06/15 0545  NA 138 143 144  K 4.7 3.6 3.8  CL 108 116* 116*  CO2 20* 19* 21*  GLUCOSE 107* 77 105*  BUN 93* 55* 45*  CREATININE 1.88* 1.34* 1.52*  CALCIUM 9.0 8.6* 8.3*   LFT  Recent Labs  09/03/15 1801 09/05/15 0802  PROT 7.3 5.9*  ALBUMIN 3.4* 2.8*  AST 32 28  ALT 18 13*  ALKPHOS 100 80  BILITOT 0.2* 0.9    PT/INR No results for input(s): LABPROT, INR in the last 72 hours. Hepatitis Panel No results for input(s): HEPBSAG, HCVAB, HEPAIGM, HEPBIGM in the last 72 hours.  Studies/Results: No results found.  ASSESMENT:   *  GI bleed, melena.  s'p cauterization of cecal avm and removal of ascending polyp  *  Anemia. Hgb drop of 0.3 grams c/w stability after transfusion of 2 PRBCs.    PLAN   *  Ok to discharge home from GI perspective.  Should have outpt repeat CBC next week to assure stability and , hopefully, improvement.  Spoke with dtr Blanch Media and explained that from GI view pt ok to discharge.  dtr wants to talk to Dr Karleen Hampshire regarding other issues.   *  Return to Dr Odie Sera prn for future GI issues.     Azucena Freed  09/06/2015, 11:20 AM Pager: [phone]  GI ATTENDING  Asked by primary service if okay for discharge from GI perspective. History, laboratories, x-rays reviewed. Agree with progress note as outlined above. No evidence for GI bleeding post endoscopic hemostatic therapy. No further recommendations from GI standpoint. Okay to go home from GI standpoint. All non-GI issues to  be addressed by PCP. Post hospital GI follow-up as needed with her primary GI and [facility].  Docia Chuck. Geri Seminole., M.D. [facility] Division of Gastroenterology

## 2015-09-06 NOTE — Care Management (Signed)
CM was notified of patient's daughter's request to speak with this CM. CM called Blanch Media. Blanch Media states her mother is not ready for discharge due to the weather and her hemoglobin. She reports her mother does everything for herself at home, her laundry, prepares meals, etc. States Hospice has not been helpful. They do not take care of her mother as she expected. She voiced concerns about no one being available to provide care as soon as her mother arrives home. She states CM does not understand that hospice is not going to be there when her mother arrives. Discussed no home care agency provides 24 hour care unless the person is paying privately. She states her mother's hemoglobin was 8.7 yesterday and is 8.3 today which she thinks proves it is necessary for her to remain in the hospital. CM explained Medicare will not approve days in the hospital due to the weather and if the patient is deemed medically ready to go home there is not anything the hospital can do to have her remain here.She states the doctor can make up something with all the medical problems her mother has to allow her to remain inpatient. CM asked if she is still interested in appealing the discharge if the order is written as she stated to the nurse previously. She states she was told by another CM that if the appeal is denied, the patient will be billed for the non-covered days. CM confirms her understanding is correct. Blanch Media states she is not at the point of wanting to appeal. Discussed the patient has not been discharged yet but there is a note for possible d/c today, CM is attempting to ensure her home health needs are set-up prior to the patient being discharged. She asked if the physician has been changed as she requested and asked if the new physician will call her. She mentioned her mother needing PT and OT. She asked if someone will notify her as to whether or not the patient has a new physician. CM states she will follow-up and call her  back. Blanch Media agrees. Research scientist (physical sciences) BSN CCM

## 2015-09-06 NOTE — Care Management (Signed)
Call to Dr. Allyson Sabal. Confirmed the patient has been assigned to a different physician and will be seen by GI today. Presenter, broadcasting BSN CCM

## 2015-09-06 NOTE — Evaluation (Signed)
Physical Therapy Evaluation Patient Details Name: [name] MRN: [id number] DOB: 1923/12/12 Today's Date: 09/06/2015   History of Present Illness  Patient is an 82 yo female admitted 09/03/15 with acute anemia, GIB.  PMH:  CAD, CABG, CKR, back pain, HTN  Clinical Impression  Patient presents with problems listed below.  Will benefit from acute PT to maximize functional mobility prior to discharge home.    Follow Up Recommendations Home health PT;Supervision - Intermittent    Equipment Recommendations  Other (comment) (Tub bench)    Recommendations for Other Services       Precautions / Restrictions Precautions Precautions: Fall Restrictions Weight Bearing Restrictions: No      Mobility  Bed Mobility               General bed mobility comments: Patient in chair as PT entered room  Transfers Overall transfer level: Needs assistance Equipment used: Rolling walker (2 wheeled) Transfers: Sit to/from Stand Sit to Stand: Min guard         General transfer comment: Patient using correct hand placement and technique.  Assist for stability/safety during transfers.  Ambulation/Gait Ambulation/Gait assistance: Min guard Ambulation Distance (Feet): 60 Feet Assistive device: Rolling walker (2 wheeled) Gait Pattern/deviations: Step-through pattern;Decreased stride length;Shuffle;Trunk flexed Gait velocity: decreased Gait velocity interpretation: Below normal speed for age/gender General Gait Details: Patient with slow, slightly unsteady gait.  Demonstrates safe use of RW.  Assist for balance/safety.  Fatigues quickly.  Stairs            Wheelchair Mobility    Modified Rankin (Stroke Patients Only)       Balance Overall balance assessment: Needs assistance         Standing balance support: No upper extremity supported Standing balance-Leahy Scale: Fair Standing balance comment: Able to maintain static balance in stance.  Requires UE support and  assist for dynamic activities.                             Pertinent Vitals/Pain Pain Assessment: No/denies pain (sitting still in chair)    Home Living Family/patient expects to be discharged to:: Private residence Living Arrangements: Spouse/significant other;Children Available Help at Discharge: Family;Available PRN/intermittently (Daughter works; patient assists husband) Type of Home: House       Home Layout: One level Home Equipment: Walker - 4 wheels Additional Comments: Husband uses RW and O2 at home.  Hospice is in the home.    Prior Function Level of Independence: Independent with assistive device(s)         Comments: Uses rollator.  Struggles to prepare meals.  Daughter assists with lunch.     Hand Dominance        Extremity/Trunk Assessment   Upper Extremity Assessment: Generalized weakness (RA changes in hands)           Lower Extremity Assessment: Generalized weakness      Cervical / Trunk Assessment: Kyphotic  Communication   Communication: No difficulties  Cognition Arousal/Alertness: Awake/alert Behavior During Therapy: WFL for tasks assessed/performed Overall Cognitive Status: Within Functional Limits for tasks assessed                      General Comments      Exercises        Assessment/Plan    PT Assessment Patient needs continued PT services  PT Diagnosis Difficulty walking;Abnormality of gait;Generalized weakness   PT Problem List Decreased strength;Decreased activity tolerance;Decreased balance;Decreased  mobility;Pain  PT Treatment Interventions DME instruction;Gait training;Functional mobility training;Therapeutic activities;Balance training   PT Goals (Current goals can be found in the Care Plan section) Acute Rehab PT Goals Patient Stated Goal: To get stronger PT Goal Formulation: With patient Time For Goal Achievement: 09/13/15 Potential to Achieve Goals: Good    Frequency Min 3X/week    Barriers to discharge Decreased caregiver support Patient has been assisting husband.  Daughter available prn.  Does have Hospice in home.    Co-evaluation               End of Session Equipment Utilized During Treatment: Gait belt Activity Tolerance: Patient limited by fatigue Patient left: in chair;with call bell/phone within reach;with chair alarm set Nurse Communication: Mobility status (d/c needs)         Time: [id number] PT Time Calculation (min) (ACUTE ONLY): 29 min   Charges:   PT Evaluation $PT Eval Moderate Complexity: 1 Procedure PT Treatments $Gait Training: 8-22 mins   PT G Codes:        Despina Pole 2015-10-02, 1:22 PM Carita Pian. Sanjuana Kava, [place] Pager [phone]

## 2015-09-06 NOTE — Care Management Note (Signed)
Case Management Note  Patient Details  Name: [name] MRN: [id number] Date of Birth: [birth year]  Subjective/Objective:                  Acute blood loss anemia  Action/Plan: CM spoke with patient. She states she had hospice care with [facility] prior to this admission and plans to resume care with them when she is discharged. Reports hospice is currently providing her husband's care for 5 days while she is inpatient. States she will call to set-up her care once she is discharged. States she was told by someone this morning transportation to her daughter's home can be arranged for today. She does not want to go to her daughter's home because her granddaughter is sick in [place] and needs to return to school in [place] by Monday. She states that will not happen. Patient states she will sit in the chair all day today if she has to because she does not want to go home. Discussed CM contacted [facility] to arrange her care for post-discharge which will prevent her from having to call the agency when she returns home. She agrees. CM spoke with Mellody Memos, nurse with [facility]. She reports the patient discontinued hospice last week to come to the hospital. States they can re-admit her to hospice but need to ensure the patient's goals are aligned with hospice care. Mellody Memos will contact Blanch Media, the patient's daughter now to discuss the goals.   Expected Discharge Date:   09/06/15               Expected Discharge Plan:  Home w Hospice Care  In-House Referral:     Discharge planning Services  CM Consult  Post Acute Care Choice:  Hospice Choice offered to:  Patient  DME Arranged:  N/A DME Agency:  NA  HH Arranged:    [place] Agency:  [facility]  Status of Service:  In process, will continue to follow  Medicare Important Message Given:    Date Medicare IM Given:    Medicare IM give by:    Date Additional Medicare IM Given:    Additional Medicare  Important Message give by:     If discussed at [place] of Stay Meetings, dates discussed:    Additional Comments:  Apolonio Schneiders, RN 09/06/2015, 10:41 AM

## 2015-09-06 NOTE — Discharge Summary (Addendum)
Physician Discharge Summary  [name] MRN: [id number] DOB/AGE: Jan 23, 1924 82 y.o.  PCP: Nicoletta Dress, MD   Admit date: 09/03/2015 Discharge date: 09/06/2015  Discharge Diagnoses:   Principal Problem:   Acute blood loss anemia Active Problems:   Hypothyroidism   Essential hypertension   CAD, ARTERY BYPASS GRAFT   Melena   GIB (gastrointestinal bleeding)   Palliative care encounter    Follow-up recommendations Follow-up with PCP in 3-5 days , including all  additional recommended appointments as below Follow-up CBC, CMP in 3-5 days  patient is is followed by hospice at home May need home health      Medication List    STOP taking these medications        famotidine 10 MG tablet  Commonly known as:  PEPCID     furosemide 40 MG tablet  Commonly known as:  LASIX      TAKE these medications        acetaminophen 500 MG tablet  Commonly known as:  TYLENOL  Take 500 mg by mouth daily as needed for moderate pain.     ACTONEL 35 MG tablet  Generic drug:  risedronate  Take 35 mg by mouth every Saturday.     allopurinol 100 MG tablet  Commonly known as:  ZYLOPRIM  Take 200 mg by mouth daily.     atorvastatin 40 MG tablet  Commonly known as:  LIPITOR  Take 40 mg by mouth every evening.     bisacodyl 5 MG EC tablet  Commonly known as:  DULCOLAX  Take 5 mg by mouth daily as needed for mild constipation or moderate constipation.     conjugated estrogens vaginal cream  Commonly known as:  PREMARIN  Place vaginally every Tuesday.     ezetimibe 10 MG tablet  Commonly known as:  ZETIA  Take 10 mg by mouth daily.     ferrous sulfate 325 (65 FE) MG EC tablet  Take 1 tablet (325 mg total) by mouth every 12 (twelve) hours.     hydroxypropyl methylcellulose / hypromellose 2.5 % ophthalmic solution  Commonly known as:  ISOPTO TEARS / GONIOVISC  Place 1 drop into both eyes daily as needed for dry eyes.     KLOR-CON M20 20 MEQ tablet  Generic drug:   potassium chloride SA  Take 20 mEq by mouth 2 (two) times daily.     levothyroxine 75 MCG tablet  Commonly known as:  SYNTHROID, LEVOTHROID  Take 75 mcg by mouth daily.     loratadine 10 MG tablet  Commonly known as:  CLARITIN  Take 10 mg by mouth daily.     losartan 25 MG tablet  Commonly known as:  COZAAR  Take 50 mg by mouth daily.     METAMUCIL PO  Take 1 capsule by mouth 3 (three) times daily.     pantoprazole 40 MG tablet  Commonly known as:  PROTONIX  Take 1 tablet (40 mg total) by mouth daily.     sodium chloride 0.65 % Soln nasal spray  Commonly known as:  OCEAN  Place 1 spray into both nostrils daily as needed for congestion.         Discharge Condition:    Discharge Instructions         Disposition: 01-Home or Self Care   Consults:   Gastroenterology    Significant Diagnostic Studies:  No results found.     Filed Weights   09/04/15 0700  Weight: 43.999 kg (97 lb)  Microbiology: No results found for this or any previous visit (from the past 240 hour(s)).     Blood Culture    Component Value Date/Time   SDES URINE, RANDOM 08/17/2009 1816   SPECREQUEST NONE 08/17/2009 1816   CULT INSIGNIFICANT GROWTH 08/17/2009 1816   REPTSTATUS 08/19/2009 FINAL 08/17/2009 1816      Labs: Results for orders placed or performed during the hospital encounter of 09/03/15 (from the past 48 hour(s))  Comprehensive metabolic panel     Status: Abnormal   Collection Time: 09/05/15  8:02 AM  Result Value Ref Range   Sodium 143 135 - 145 mmol/L   Potassium 3.6 3.5 - 5.1 mmol/L   Chloride 116 (H) 101 - 111 mmol/L   CO2 19 (L) 22 - 32 mmol/L   Glucose, Bld 77 65 - 99 mg/dL   BUN 55 (H) 6 - 20 mg/dL   Creatinine, Ser 8.95 (H) 0.44 - 1.00 mg/dL   Calcium 8.6 (L) 8.9 - 10.3 mg/dL   Total Protein 5.9 (L) 6.5 - 8.1 g/dL   Albumin 2.8 (L) 3.5 - 5.0 g/dL   AST 28 15 - 41 U/L   ALT 13 (L) 14 - 54 U/L   Alkaline Phosphatase 80 38 - 126 U/L    Total Bilirubin 0.9 0.3 - 1.2 mg/dL   GFR calc non Af Amer 34 (L) >60 mL/min   GFR calc Af Amer 39 (L) >60 mL/min    Comment: (NOTE) The eGFR has been calculated using the CKD EPI equation. This calculation has not been validated in all clinical situations. eGFR's persistently <60 mL/min signify possible Chronic Kidney Disease.    Anion gap 8 5 - 15  CBC     Status: Abnormal   Collection Time: 09/05/15  8:02 AM  Result Value Ref Range   WBC 9.0 4.0 - 10.5 K/uL   RBC 2.80 (L) 3.87 - 5.11 MIL/uL   Hemoglobin 8.7 (L) 12.0 - 15.0 g/dL    Comment: POST TRANSFUSION SPECIMEN   HCT 26.6 (L) 36.0 - 46.0 %   MCV 95.0 78.0 - 100.0 fL   MCH 31.1 26.0 - 34.0 pg   MCHC 32.7 30.0 - 36.0 g/dL   RDW 70.2 (H) 20.2 - 66.9 %   Platelets 172 150 - 400 K/uL  CBC     Status: Abnormal   Collection Time: 09/06/15  5:45 AM  Result Value Ref Range   WBC 8.6 4.0 - 10.5 K/uL   RBC 2.68 (L) 3.87 - 5.11 MIL/uL   Hemoglobin 8.4 (L) 12.0 - 15.0 g/dL   HCT 16.7 (L) 56.1 - 25.4 %   MCV 97.0 78.0 - 100.0 fL   MCH 31.3 26.0 - 34.0 pg   MCHC 32.3 30.0 - 36.0 g/dL   RDW 83.2 (H) 34.6 - 88.7 %   Platelets 177 150 - 400 K/uL  Basic metabolic panel     Status: Abnormal   Collection Time: 09/06/15  5:45 AM  Result Value Ref Range   Sodium 144 135 - 145 mmol/L   Potassium 3.8 3.5 - 5.1 mmol/L   Chloride 116 (H) 101 - 111 mmol/L   CO2 21 (L) 22 - 32 mmol/L   Glucose, Bld 105 (H) 65 - 99 mg/dL   BUN 45 (H) 6 - 20 mg/dL   Creatinine, Ser 3.73 (H) 0.44 - 1.00 mg/dL   Calcium 8.3 (L) 8.9 - 10.3 mg/dL   GFR calc non Af Amer 29 (L) >60 mL/min   GFR  calc Af Amer 34 (L) >60 mL/min    Comment: (NOTE) The eGFR has been calculated using the CKD EPI equation. This calculation has not been validated in all clinical situations. eGFR's persistently <60 mL/min signify possible Chronic Kidney Disease.    Anion gap 7 5 - 15     Lipid Panel  No results found for: CHOL, TRIG, HDL, CHOLHDL, VLDL, LDLCALC,  LDLDIRECT   Lab Results  Component Value Date   HGBA1C 5.4 05/03/2015     Lab Results  Component Value Date   CREATININE 1.52* 09/06/2015     HPI :82 year old female with a PMH of CAD s/p CABG, CKD, AAA, hypothyroidism, and HTN admitted for melenic stools. She started to have melena last week and she reports 3 black stools that was associated with dizziness. She currently has melena. The patient had a similar presentation in 05/2015 and she underwent further evaluation with Dr. Hilarie Fredrickson as an unassigned patient. The EGD was negative for any bleeding source and a colonoscopy was recommended, but she declined. The patient states that she had two prior colonoscopies with Dr. Odie Sera in [place] and she was told that she did not need any repeat screening procedures. At that time, in 05/2015, she was taking Plavix, but this was discontinued.  HOSPITAL COURSE:  GI bleeding Presented with 3 day melanotic stools, hemoglobin 6.8, receiving 2 units of packed red blood cells, now 8.7>8.4 Prior EGD showed 05/04/15 no evidence of bleeding. Patient refused colonoscopy, GI recommended to continue to hold Plavix, last stent she had about 16 years ago. Follow [facility] GI consultation,  Dr Benson Norway, She agreed to proceed with colonoscopy. As well as endoscopy Apparently on hospice at home , came back for transfusion dependent anemia  Anticipate discharge with home health vs  home hospice, patient is a DO NOT RESUSCITATE,  We will request Dr. Carol Ada to see patient on 09/06/15 and determine if the patient is stable for discharge today  EGD IMPRESSION: 1) Nonobstructive Schatzki's ring. 2) Small hiatal hernia. 3) ? distal duodenal villous blunting  Colonoscopy IMPRESSION: 1) Cecal AVMs s/p APC. Probable sites of bleeding. 2) Colonic polyp.  Dc home in am if HG stable as per Dr. Jossie Ng colonoscopy report on  1/6    Acute blood loss anemia Patient has acute blood loss anemia on top  of anemia of chronic disease, reported she was getting Aranesp periodically as. Patient appears to have chronic anemia secondary to CKD stage 3-4. Hemoglobin stable overnight, status post 2 units of packed RBCs.   Essential hypertension Hold antihypertensives medications, because of concurrent bleed.  Hypothyroidism continue Synthroid dose.  Acute kidney injury in the setting off Stage III-IV CKD Patient has stage III to stage IV CKD, per Dr. Delena Bali' notes creatinine was 1.7 back in March 2016. Patient creatinine 1.88 >1.34>155      Discharge Exam:    Blood pressure 136/39, pulse 80, temperature 98.9 F (37.2 C), temperature source Oral, resp. rate 16, weight 43.999 kg (97 lb), SpO2 94 %.  General appearance: Frail elderly female, alert and in no acute distress.  Eyes: Anicteric, conjunctiva pink, lids and lashes normal.  ENT: No nasal deformity, discharge, or epistaxis. OP moist without lesions.  Skin: Warm and dry. No jaundice or pallor. Cardiac: RRR, nl L4-T6, loud systolic murmur. No LE edema. Radial pulses 2+ and symmetric. Respiratory: Normal respiratory rate and rhythm. CTAB without rales or wheezes. Abdomen: Abdomen soft without rigidity. No TTP.Ventral hernia, that is without induration  or redness or pain.  MSK: Finger deformed by osteoarthritis. Vertebral tenderness moderate at T4 level. Paraspinal tenderness throughout upper back and trapezius tendernes bilaterally. Neuro: Sensorium intact and responding to questions, attention normal. Speech is fluent. Moves all extremities equally and with normal coordination.  Psych: Behavior appropriate. Affect anxious. No evidence of aural or visual hallucinations or delusions.     Follow-up Information    Follow up with SCHULTZ,DOUGLAS E, MD. Schedule an appointment as soon as possible for a visit in 3 days.   Specialty:  Internal Medicine   Contact information:   [place] [address] [phone]       Signed: Reyne Dumas 09/06/2015, 9:32 AM        Time spent >45 mins

## 2015-09-08 ENCOUNTER — Encounter (HOSPITAL_COMMUNITY): Payer: Self-pay | Admitting: Gastroenterology

## 2015-11-29 DEATH — deceased
